# Patient Record
Sex: Male | Born: 1990 | Race: Black or African American | Hispanic: No | Marital: Single | Smoking: Former smoker
Health system: Southern US, Community
[De-identification: ages and names within clinical notes are randomized; demographics above are authoritative.]

## PROBLEM LIST (undated history)

## (undated) ENCOUNTER — Emergency Department (HOSPITAL_COMMUNITY): Disposition: A | Discharge status: Other Acute Inpatient Hospital | Payer: BLUE CROSS/BLUE SHIELD

## (undated) DIAGNOSIS — K219 Gastro-esophageal reflux disease without esophagitis: Secondary | ICD-10-CM

## (undated) DIAGNOSIS — K921 Melena: Secondary | ICD-10-CM

## (undated) DIAGNOSIS — B019 Varicella without complication: Secondary | ICD-10-CM

## (undated) DIAGNOSIS — G43909 Migraine, unspecified, not intractable, without status migrainosus: Secondary | ICD-10-CM

## (undated) HISTORY — DX: Migraine, unspecified, not intractable, without status migrainosus: G43.909

## (undated) HISTORY — DX: Gastro-esophageal reflux disease without esophagitis: K21.9

## (undated) HISTORY — DX: Melena: K92.1

## (undated) HISTORY — DX: Varicella without complication: B01.9

## (undated) HISTORY — PX: HERNIA REPAIR: SHX51

---
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
## 2009-10-03 ENCOUNTER — Emergency Department (HOSPITAL_COMMUNITY): Admission: EM | Admit: 2009-10-03 | Discharge: 2009-10-04 | Payer: Self-pay | Admitting: Emergency Medicine

## 2010-01-11 ENCOUNTER — Emergency Department (HOSPITAL_COMMUNITY): Admission: EM | Admit: 2010-01-11 | Discharge: 2010-01-11 | Payer: Self-pay | Admitting: Emergency Medicine

## 2010-02-09 ENCOUNTER — Emergency Department (HOSPITAL_COMMUNITY): Admission: EM | Admit: 2010-02-09 | Discharge: 2009-04-23 | Payer: Self-pay | Admitting: Emergency Medicine

## 2010-05-16 LAB — URINALYSIS, ROUTINE W REFLEX MICROSCOPIC
Bilirubin Urine: NEGATIVE
Glucose, UA: NEGATIVE mg/dL
Hgb urine dipstick: NEGATIVE
Ketones, ur: NEGATIVE mg/dL
Nitrite: NEGATIVE
Protein, ur: NEGATIVE mg/dL
Specific Gravity, Urine: 1.007 (ref 1.005–1.030)
Urobilinogen, UA: 0.2 mg/dL (ref 0.0–1.0)
pH: 7 (ref 5.0–8.0)

## 2010-05-16 LAB — GC/CHLAMYDIA PROBE AMP, GENITAL
Chlamydia, DNA Probe: NEGATIVE
GC Probe Amp, Genital: NEGATIVE

## 2010-05-19 LAB — GC/CHLAMYDIA PROBE AMP, GENITAL
Chlamydia, DNA Probe: POSITIVE — AB
GC Probe Amp, Genital: NEGATIVE

## 2010-05-19 LAB — RPR: RPR Ser Ql: NONREACTIVE

## 2010-05-25 LAB — URINALYSIS, ROUTINE W REFLEX MICROSCOPIC
Bilirubin Urine: NEGATIVE
Glucose, UA: NEGATIVE mg/dL
Hgb urine dipstick: NEGATIVE
Ketones, ur: NEGATIVE mg/dL
Nitrite: NEGATIVE
Protein, ur: NEGATIVE mg/dL
Specific Gravity, Urine: 1.023 (ref 1.005–1.030)
Urobilinogen, UA: 1 mg/dL (ref 0.0–1.0)
pH: 6.5 (ref 5.0–8.0)

## 2010-05-25 LAB — URINE MICROSCOPIC-ADD ON

## 2012-03-10 ENCOUNTER — Encounter (HOSPITAL_COMMUNITY): Payer: Self-pay | Admitting: Adult Health

## 2012-03-10 ENCOUNTER — Emergency Department (HOSPITAL_COMMUNITY)
Admission: EM | Admit: 2012-03-10 | Discharge: 2012-03-10 | Disposition: A | Discharge status: Home / Self Care | Payer: Self-pay | Attending: Emergency Medicine | Admitting: Emergency Medicine

## 2012-03-10 DIAGNOSIS — R059 Cough, unspecified: Secondary | ICD-10-CM | POA: Insufficient documentation

## 2012-03-10 DIAGNOSIS — J069 Acute upper respiratory infection, unspecified: Secondary | ICD-10-CM | POA: Insufficient documentation

## 2012-03-10 DIAGNOSIS — J3489 Other specified disorders of nose and nasal sinuses: Secondary | ICD-10-CM | POA: Insufficient documentation

## 2012-03-10 DIAGNOSIS — R05 Cough: Secondary | ICD-10-CM | POA: Insufficient documentation

## 2012-03-10 MED ORDER — PROMETHAZINE-CODEINE 6.25-10 MG/5ML PO SYRP
5.0000 mL | ORAL_SOLUTION | Freq: Once | ORAL | Status: AC
Start: 2012-03-10 — End: 2012-03-10
  Administered 2012-03-10: 5 mL via ORAL
  Filled 2012-03-10: qty 5

## 2012-03-10 MED ORDER — PROMETHAZINE-CODEINE 6.25-10 MG/5ML PO SYRP: 5.0000 mL | ORAL_SOLUTION | Freq: Four times a day (QID) | ORAL | Status: DC | PRN

## 2012-03-10 NOTE — ED Provider Notes (Signed)
Medical screening examination/treatment/procedure(s) were performed by non-physician practitioner and as supervising physician I was immediately available for consultation/collaboration.   Glynn Octave, MD 03/10/12 [phone]

## 2012-03-10 NOTE — ED Notes (Signed)
C/O cold for one week, symptoms include cough, sore throat and runny nose. Pt reports taking mucinex and tylenol cold which has helped the sore throat and mucus. He is concerned because he has a non productive cough. He states, "I searched the internet and I am worried I have whooping cough or pertussis." cough present, non productive, Sats 96% RA, airway patent, breathing WNL. Bilateral lung sounds clear.

## 2012-03-10 NOTE — ED Provider Notes (Signed)
History     CSN: [id number]  Arrival date & time 03/10/12  0230   First MD Initiated Contact with Patient 03/10/12 0355      Chief Complaint  Patient presents with  . Cough    (Consider location/radiation/quality/duration/timing/severity/associated sxs/prior treatment) HPI Comments: Patient with cough and URI for over 1 week now mostly the cough which is keeping him awake and interfering with work.  Denies fever, mylagias  Patient is a 22 y.o. male presenting with cough. The history is provided by the patient.  Cough This is a new problem. The current episode started more than 1 week ago. The problem occurs every few minutes. The problem has been gradually worsening. The cough is non-productive. Pertinent negatives include no chills, no headaches, no rhinorrhea and no shortness of breath.    History reviewed. No pertinent past medical history.  History reviewed. No pertinent past surgical history.  History reviewed. No pertinent family history.  History  Substance Use Topics  . Smoking status: Never Smoker   . Smokeless tobacco: Not on file  . Alcohol Use: No      Review of Systems  Constitutional: Negative for fever and chills.  HENT: Positive for congestion. Negative for rhinorrhea and postnasal drip.   Eyes: Negative.   Respiratory: Positive for cough. Negative for shortness of breath.   Gastrointestinal: Negative for nausea.  Genitourinary: Negative.   Neurological: Negative for headaches.  Hematological: Negative for adenopathy.    Allergies  Review of patient's allergies indicates no known allergies.  Home Medications   Current Outpatient Rx  Name  Route  Sig  Dispense  Refill  . PROMETHAZINE-CODEINE 6.25-10 MG/5ML PO SYRP   Oral   Take 5 mLs by mouth every 6 (six) hours as needed for cough.   120 mL   0     BP 123/73  Pulse 75  Temp 97.8 F (36.6 C) (Oral)  Resp 16  SpO2 96%  Physical Exam  Constitutional: He appears well-developed and  well-nourished.  HENT:  Head: Normocephalic.  Mouth/Throat: Uvula is midline. No oropharyngeal exudate, posterior oropharyngeal edema, posterior oropharyngeal erythema or tonsillar abscesses.  Eyes: Pupils are equal, round, and reactive to light.  Neck: Normal range of motion.  Cardiovascular: Normal rate.   Pulmonary/Chest: Breath sounds normal. No respiratory distress. He has no wheezes.       Dry cough in short burst   Musculoskeletal: Normal range of motion.  Neurological: He is alert.  Skin: Skin is warm.    ED Course  Procedures (including critical care time)  Labs Reviewed - No data to display No results found.   1. Cough       MDM  Cough wil treat with Phenergan with codeine         Arman Filter, NP 03/10/12 0434  Arman Filter, NP 03/10/12 0434  Arman Filter, NP 03/10/12 [phone]

## 2012-06-22 ENCOUNTER — Encounter (HOSPITAL_COMMUNITY): Payer: Self-pay | Admitting: *Deleted

## 2012-06-22 ENCOUNTER — Emergency Department (HOSPITAL_COMMUNITY)
Admission: EM | Admit: 2012-06-22 | Discharge: 2012-06-23 | Disposition: A | Discharge status: Home / Self Care | Payer: Self-pay | Attending: Emergency Medicine | Admitting: Emergency Medicine

## 2012-06-22 DIAGNOSIS — R590 Localized enlarged lymph nodes: Secondary | ICD-10-CM

## 2012-06-22 DIAGNOSIS — R599 Enlarged lymph nodes, unspecified: Secondary | ICD-10-CM | POA: Insufficient documentation

## 2012-06-22 LAB — URINALYSIS, ROUTINE W REFLEX MICROSCOPIC
Bilirubin Urine: NEGATIVE
Glucose, UA: NEGATIVE mg/dL
Hgb urine dipstick: NEGATIVE
Ketones, ur: NEGATIVE mg/dL
Leukocytes, UA: NEGATIVE
Nitrite: NEGATIVE
Protein, ur: NEGATIVE mg/dL
Specific Gravity, Urine: 1.021 (ref 1.005–1.030)
Urobilinogen, UA: 1 mg/dL (ref 0.0–1.0)
pH: 7.5 (ref 5.0–8.0)

## 2012-06-22 LAB — CBC WITH DIFFERENTIAL/PLATELET
Basophils Absolute: 0 10*3/uL (ref 0.0–0.1)
Basophils Relative: 0 % (ref 0–1)
Eosinophils Absolute: 0.1 10*3/uL (ref 0.0–0.7)
Eosinophils Relative: 2 % (ref 0–5)
HCT: 40.9 % (ref 39.0–52.0)
Hemoglobin: 15 g/dL (ref 13.0–17.0)
Lymphocytes Relative: 53 % — ABNORMAL HIGH (ref 12–46)
Lymphs Abs: 3 10*3/uL (ref 0.7–4.0)
MCH: 28.6 pg (ref 26.0–34.0)
MCHC: 36.7 g/dL — ABNORMAL HIGH (ref 30.0–36.0)
MCV: 77.9 fL — ABNORMAL LOW (ref 78.0–100.0)
Monocytes Absolute: 0.5 10*3/uL (ref 0.1–1.0)
Monocytes Relative: 8 % (ref 3–12)
Neutro Abs: 2.1 10*3/uL (ref 1.7–7.7)
Neutrophils Relative %: 37 % — ABNORMAL LOW (ref 43–77)
Platelets: 198 10*3/uL (ref 150–400)
RBC: 5.25 MIL/uL (ref 4.22–5.81)
RDW: 11.9 % (ref 11.5–15.5)
WBC: 5.6 10*3/uL (ref 4.0–10.5)

## 2012-06-22 LAB — COMPREHENSIVE METABOLIC PANEL
ALT: 23 U/L (ref 0–53)
AST: 28 U/L (ref 0–37)
Albumin: 4.2 g/dL (ref 3.5–5.2)
Alkaline Phosphatase: 68 U/L (ref 39–117)
BUN: 17 mg/dL (ref 6–23)
CO2: 30 mEq/L (ref 19–32)
Calcium: 9.6 mg/dL (ref 8.4–10.5)
Chloride: 100 mEq/L (ref 96–112)
Creatinine, Ser: 1.28 mg/dL (ref 0.50–1.35)
GFR calc Af Amer: >90 mL/min (ref >90)
GFR calc non Af Amer: 78 mL/min — ABNORMAL LOW (ref >90)
Glucose, Bld: 94 mg/dL (ref 70–99)
Potassium: 3.5 mEq/L (ref 3.5–5.1)
Sodium: 137 mEq/L (ref 135–145)
Total Bilirubin: 1.4 mg/dL — ABNORMAL HIGH (ref 0.3–1.2)
Total Protein: 7.7 g/dL (ref 6.0–8.3)

## 2012-06-22 NOTE — ED Notes (Signed)
Lower abd pain for  3 days no nv or diarrhea.

## 2012-06-23 LAB — GC/CHLAMYDIA PROBE AMP
CT Probe RNA: NEGATIVE
GC Probe RNA: NEGATIVE

## 2012-06-23 MED ORDER — AZITHROMYCIN 250 MG PO TABS
2000.0000 mg | ORAL_TABLET | Freq: Once | ORAL | Status: AC
  Administered 2012-06-23: 2000 mg via ORAL
  Filled 2012-06-23: qty 8

## 2012-06-23 NOTE — ED Provider Notes (Signed)
Medical screening examination/treatment/procedure(s) were performed by non-physician practitioner and as supervising physician I was immediately available for consultation/collaboration.   Mallorey Odonell, MD 06/23/12 0416 

## 2012-06-23 NOTE — ED Provider Notes (Signed)
History     CSN: [id number]  Arrival date & time 06/22/12  2210   First MD Initiated Contact with Patient 06/22/12 2352      Chief Complaint  Patient presents with  . Abdominal Pain    (Consider location/radiation/quality/duration/timing/severity/associated sxs/prior treatment) Patient is a 22 y.o. male presenting with abdominal pain. The history is provided by the patient.  Abdominal Pain Pain location:  LUQ and RLQ Pain quality: aching   Pain severity:  Mild Associated symptoms: no diarrhea, no fever, no nausea and no vomiting   Associated symptoms comment:  Mild lower abdominal pain and "I have knots that hurt". No N, V, fever. No change in bowel habits. No recent abdominal injuries. No back pain. He denies dysuria, penile discharge or testicular discomfort.   History reviewed. No pertinent past medical history.  History reviewed. No pertinent past surgical history.  No family history on file.  History  Substance Use Topics  . Smoking status: Never Smoker   . Smokeless tobacco: Not on file  . Alcohol Use: No      Review of Systems  Constitutional: Negative for fever.  Gastrointestinal: Positive for abdominal pain. Negative for nausea, vomiting, diarrhea and blood in stool.  Genitourinary: Negative for discharge and testicular pain.  Musculoskeletal: Negative for back pain.    Allergies  Penicillins  Home Medications  No current outpatient prescriptions on file.  BP 132/78  Pulse 68  Temp(Src) 98.8 F (37.1 C) (Oral)  Resp 18  SpO2 98%  Physical Exam  Constitutional: He is oriented to person, place, and time. He appears well-developed and well-nourished. No distress.  Abdominal: Soft. He exhibits no mass. There is no tenderness. There is no rebound and no guarding.  Genitourinary:  No testicular tenderness or scrotal swelling. Circumcised penis without obvious discharge. Bilateral inguinal adenopathy.  Neurological: He is alert and oriented to person,  place, and time.  Skin: Skin is warm and dry.  Psychiatric: He has a normal mood and affect.    ED Course  Procedures (including critical care time)  Labs Reviewed  CBC WITH DIFFERENTIAL - Abnormal; Notable for the following:    MCV 77.9 (*)    MCHC 36.7 (*)    Neutrophils Relative 37 (*)    Lymphocytes Relative 53 (*)    All other components within normal limits  COMPREHENSIVE METABOLIC PANEL - Abnormal; Notable for the following:    Total Bilirubin 1.4 (*)    GFR calc non Af Amer 78 (*)    All other components within normal limits  GC/CHLAMYDIA PROBE AMP  URINALYSIS, ROUTINE W REFLEX MICROSCOPIC   No results found.   No diagnosis found.  1. Inguinal lymphadenopathy   MDM  Abdomen completely benign, non-tender. Given inguinal lymphadenopathy, suspect pelvic infection, most likely STD. Patient treated with azithromycin. Cultures pending.        Arnoldo Hooker, PA-C 06/23/12 0037

## 2012-06-26 ENCOUNTER — Encounter (HOSPITAL_COMMUNITY): Payer: Self-pay | Admitting: Adult Health

## 2012-06-26 ENCOUNTER — Emergency Department (HOSPITAL_COMMUNITY)
Admission: EM | Admit: 2012-06-26 | Discharge: 2012-06-26 | Disposition: A | Discharge status: Home / Self Care | Payer: Self-pay | Attending: Emergency Medicine | Admitting: Emergency Medicine

## 2012-06-26 DIAGNOSIS — L738 Other specified follicular disorders: Secondary | ICD-10-CM | POA: Insufficient documentation

## 2012-06-26 DIAGNOSIS — L739 Follicular disorder, unspecified: Secondary | ICD-10-CM

## 2012-06-26 DIAGNOSIS — R599 Enlarged lymph nodes, unspecified: Secondary | ICD-10-CM | POA: Insufficient documentation

## 2012-06-26 DIAGNOSIS — Z8619 Personal history of other infectious and parasitic diseases: Secondary | ICD-10-CM | POA: Insufficient documentation

## 2012-06-26 DIAGNOSIS — N489 Disorder of penis, unspecified: Secondary | ICD-10-CM | POA: Insufficient documentation

## 2012-06-26 MED ORDER — MUPIROCIN CALCIUM 2 % EX CREA: TOPICAL_CREAM | Freq: Three times a day (TID) | CUTANEOUS | Status: DC

## 2012-06-26 NOTE — ED Notes (Signed)
The patient is AOx4 and comfortable with his discharge instructions. 

## 2012-06-26 NOTE — ED Notes (Signed)
Was seen here on 4/21 and dx with inguinal lymphadenopathy, given GC/Chlamydia treatment and told to come back if symptoms do not resolve.  PT reports that lymph nodes did not go down and now he has a weeping open rash around penis and on penis, denies pain, reports soreness after urination.

## 2012-06-27 NOTE — ED Provider Notes (Signed)
History     CSN: [id number]  Arrival date & time 06/26/12  0022   None     Chief Complaint  Patient presents with  . Penis Pain  . Rash    (Consider location/radiation/quality/duration/timing/severity/associated sxs/prior treatment) HPI History provided by pt.   Pt presents w/ mildly painful rash at base of shaft of penis x several days.  Per prior chart, was evaluated for lower abd as well as groin pain on 06/23/12, was found to have inguinal lymphadenopathy, and was treated for possible STD.  GC/Chlam cultures negative.  Pt reports that the rash was present at that time but it is worse now.  Continues to have "painful knots" in groin as well.  No associated fever, abd pain, dysuria, urethral discharge or testicular pain.  No h/o STDs.    History reviewed. No pertinent past medical history.  History reviewed. No pertinent past surgical history.  History reviewed. No pertinent family history.  History  Substance Use Topics  . Smoking status: Never Smoker   . Smokeless tobacco: Not on file  . Alcohol Use: No      Review of Systems  All other systems reviewed and are negative.    Allergies  Penicillins  Home Medications   Current Outpatient Rx  Name  Route  Sig  Dispense  Refill  . mupirocin cream (BACTROBAN) 2 %   Topical   Apply topically 3 (three) times daily.   15 g   0     BP 115/77  Pulse 68  Temp(Src) 97.1 F (36.2 C) (Oral)  Resp 16  Ht 5\' 8"  (1.727 m)  Wt 140 lb (63.504 kg)  BMI 21.29 kg/m2  SpO2 98%  Physical Exam  Nursing note and vitals reviewed. Constitutional: He is oriented to person, place, and time. He appears well-developed and well-nourished. No distress.  HENT:  Head: Normocephalic and atraumatic.  Eyes:  Normal appearance  Neck: Normal range of motion.  No axillary lymphadenopathy  Cardiovascular: Normal rate and regular rhythm.   Pulmonary/Chest: Effort normal and breath sounds normal. No respiratory distress.  Abdominal:  Soft. Bowel sounds are normal. He exhibits no distension and no mass. There is no tenderness. There is no rebound and no guarding.  Genitourinary:  Pt shaves hair at base of penis.  There are several, discrete, 0.5cm scabbed lesions overlying hair follicles in this location, particularly on L side.  No drainage or surrounding erythema.  Ttp.  Circumcised.  No urethral discharge.  Testicles descended bilaterally and non-tender.  Bilateral inguinal lymphadenopathy.   Musculoskeletal: Normal range of motion.  Lymphadenopathy:    He has no cervical adenopathy.  Neurological: He is alert and oriented to person, place, and time.  Skin: Skin is warm and dry. No rash noted.  Psychiatric: He has a normal mood and affect. His behavior is normal.    ED Course  Procedures (including critical care time)  Labs Reviewed - No data to display No results found.   1. Folliculitis       MDM  22yo M presents w/ genitalia rash.  Evaluated 4/21 for groin pain that was attributed to inguinal lymphadenopathy, GC/Chlam cultures obtained at that time and neg, rash not appreciated on exam.  Exam today most consistent w/ folliculitis.   Prescribed bactroban and recommended f/u with urology for persistent sx.  Return precautions discussed.         Otilio Miu, PA-C 06/27/12 [phone]

## 2012-06-27 NOTE — ED Provider Notes (Signed)
Medical screening examination/treatment/procedure(s) were performed by non-physician practitioner and as supervising physician I was immediately available for consultation/collaboration.  Olivia Mackie, MD 06/27/12 1031

## 2013-12-23 ENCOUNTER — Encounter: Payer: Self-pay | Admitting: Internal Medicine

## 2013-12-23 ENCOUNTER — Encounter (HOSPITAL_COMMUNITY): Payer: Self-pay | Admitting: Emergency Medicine

## 2013-12-23 ENCOUNTER — Emergency Department (HOSPITAL_COMMUNITY)
Admission: EM | Admit: 2013-12-23 | Discharge: 2013-12-23 | Disposition: A | Discharge status: Home / Self Care | Payer: BC Managed Care – PPO | Attending: Emergency Medicine | Admitting: Emergency Medicine

## 2013-12-23 DIAGNOSIS — R1011 Right upper quadrant pain: Secondary | ICD-10-CM | POA: Insufficient documentation

## 2013-12-23 DIAGNOSIS — Z880 Allergy status to penicillin: Secondary | ICD-10-CM | POA: Insufficient documentation

## 2013-12-23 DIAGNOSIS — R109 Unspecified abdominal pain: Secondary | ICD-10-CM

## 2013-12-23 LAB — CBC WITH DIFFERENTIAL/PLATELET
Basophils Absolute: 0 10*3/uL (ref 0.0–0.1)
Basophils Relative: 0 % (ref 0–1)
Eosinophils Absolute: 0.2 10*3/uL (ref 0.0–0.7)
Eosinophils Relative: 3 % (ref 0–5)
HCT: 44.7 % (ref 39.0–52.0)
Hemoglobin: 16.3 g/dL (ref 13.0–17.0)
Lymphocytes Relative: 55 % — ABNORMAL HIGH (ref 12–46)
Lymphs Abs: 3.6 10*3/uL (ref 0.7–4.0)
MCH: 29.9 pg (ref 26.0–34.0)
MCHC: 36.5 g/dL — ABNORMAL HIGH (ref 30.0–36.0)
MCV: 82 fL (ref 78.0–100.0)
Monocytes Absolute: 0.3 10*3/uL (ref 0.1–1.0)
Monocytes Relative: 5 % (ref 3–12)
Neutro Abs: 2.5 10*3/uL (ref 1.7–7.7)
Neutrophils Relative %: 37 % — ABNORMAL LOW (ref 43–77)
Platelets: 205 10*3/uL (ref 150–400)
RBC: 5.45 MIL/uL (ref 4.22–5.81)
RDW: 12.1 % (ref 11.5–15.5)
WBC: 6.6 10*3/uL (ref 4.0–10.5)

## 2013-12-23 LAB — URINALYSIS, ROUTINE W REFLEX MICROSCOPIC
Bilirubin Urine: NEGATIVE
Glucose, UA: NEGATIVE mg/dL
Hgb urine dipstick: NEGATIVE
Ketones, ur: NEGATIVE mg/dL
Nitrite: NEGATIVE
Protein, ur: NEGATIVE mg/dL
Specific Gravity, Urine: 1.026 (ref 1.005–1.030)
Urobilinogen, UA: 1 mg/dL (ref 0.0–1.0)
pH: 6 (ref 5.0–8.0)

## 2013-12-23 LAB — COMPREHENSIVE METABOLIC PANEL
ALT: 17 U/L (ref 0–53)
AST: 24 U/L (ref 0–37)
Albumin: 4.5 g/dL (ref 3.5–5.2)
Alkaline Phosphatase: 68 U/L (ref 39–117)
Anion gap: 12 (ref 5–15)
BUN: 15 mg/dL (ref 6–23)
CO2: 28 mEq/L (ref 19–32)
Calcium: 9.9 mg/dL (ref 8.4–10.5)
Chloride: 102 mEq/L (ref 96–112)
Creatinine, Ser: 0.97 mg/dL (ref 0.50–1.35)
GFR calc Af Amer: >90 mL/min (ref >90)
GFR calc non Af Amer: >90 mL/min (ref >90)
Glucose, Bld: 90 mg/dL (ref 70–99)
Potassium: 3.9 mEq/L (ref 3.7–5.3)
Sodium: 142 mEq/L (ref 137–147)
Total Bilirubin: 1.3 mg/dL — ABNORMAL HIGH (ref 0.3–1.2)
Total Protein: 8.4 g/dL — ABNORMAL HIGH (ref 6.0–8.3)

## 2013-12-23 LAB — LIPASE, BLOOD: Lipase: 27 U/L (ref 11–59)

## 2013-12-23 LAB — URINE MICROSCOPIC-ADD ON

## 2013-12-23 NOTE — ED Notes (Signed)
Pt given water to drink. 

## 2013-12-23 NOTE — Discharge Instructions (Signed)

## 2013-12-23 NOTE — ED Provider Notes (Signed)
CSN: [id number]     Arrival date & time 12/23/13  0409 History   First MD Initiated Contact with Patient 12/23/13 0606     Chief Complaint  Patient presents with  . Abdominal Pain     (Consider location/radiation/quality/duration/timing/severity/associated sxs/prior Treatment) HPI Comments: Patient presents to the emergency department with chief complaint of left lower abdominal pain times one year. He states that he has had intermittent "spasms and swelling sensation" of the left lower abdomen. He states that it mostly occurs while he is at work. He denies any known aggravating or alleviating factors. Denies any fevers, chills, nausea, vomiting, diarrhea, constipation, or dysuria. He has not taken anything to alleviate his symptoms. He has no prior abdominal surgeries. No other past medical problems. He states that he wants to be evaluated today because he just got health insurance. However, currently he denies any symptoms.  The history is provided by the patient. No language interpreter was used.    History reviewed. No pertinent past medical history. History reviewed. No pertinent past surgical history. No family history on file. History  Substance Use Topics  . Smoking status: Never Smoker   . Smokeless tobacco: Not on file  . Alcohol Use: No    Review of Systems  Constitutional: Negative for fever and chills.  Respiratory: Negative for shortness of breath.   Cardiovascular: Negative for chest pain.  Gastrointestinal: Negative for nausea, vomiting, diarrhea and constipation.  Genitourinary: Negative for dysuria.  All other systems reviewed and are negative.     Allergies  Penicillins  Home Medications   Prior to Admission medications   Not on File   BP 125/81  Pulse 78  Temp(Src) 98.1 F (36.7 C) (Oral)  Resp 18  Ht 5\' 8"  (1.727 m)  Wt 145 lb (65.772 kg)  BMI 22.05 kg/m2  SpO2 100% Physical Exam  Nursing note and vitals reviewed. Constitutional: He is  oriented to person, place, and time. He appears well-developed and well-nourished.  Well-appearing, not in any distress  HENT:  Head: Normocephalic and atraumatic.  Eyes: Conjunctivae and EOM are normal. Pupils are equal, round, and reactive to light. Right eye exhibits no discharge. Left eye exhibits no discharge. No scleral icterus.  Neck: Normal range of motion. Neck supple. No JVD present.  Cardiovascular: Normal rate, regular rhythm and normal heart sounds.  Exam reveals no gallop and no friction rub.   No murmur heard. Pulmonary/Chest: Effort normal and breath sounds normal. No respiratory distress. He has no wheezes. He has no rales. He exhibits no tenderness.  Clear to auscultation bilaterally  Abdominal: Soft. He exhibits no distension and no mass. There is no tenderness. There is no rebound and no guarding.  No focal abdominal tenderness, no RLQ tenderness or pain at McBurney's point, no RUQ tenderness or Murphy's sign, no left-sided abdominal tenderness, no fluid wave, or signs of peritonitis   Musculoskeletal: Normal range of motion. He exhibits no edema and no tenderness.  Neurological: He is alert and oriented to person, place, and time.  Skin: Skin is warm and dry.  Psychiatric: He has a normal mood and affect. His behavior is normal. Judgment and thought content normal.    ED Course  Procedures (including critical care time) Results for orders placed during the hospital encounter of 12/23/13  CBC WITH DIFFERENTIAL      Result Value Ref Range   WBC 6.6  4.0 - 10.5 K/uL   RBC 5.45  4.22 - 5.81 MIL/uL   Hemoglobin 16.3  13.0 - 17.0 g/dL   HCT 04.544.7  40.939.0 - 81.152.0 %   MCV 82.0  78.0 - 100.0 fL   MCH 29.9  26.0 - 34.0 pg   MCHC 36.5 (*) 30.0 - 36.0 g/dL   RDW 91.412.1  78.211.5 - 95.615.5 %   Platelets 205  150 - 400 K/uL   Neutrophils Relative % 37 (*) 43 - 77 %   Neutro Abs 2.5  1.7 - 7.7 K/uL   Lymphocytes Relative 55 (*) 12 - 46 %   Lymphs Abs 3.6  0.7 - 4.0 K/uL   Monocytes  Relative 5  3 - 12 %   Monocytes Absolute 0.3  0.1 - 1.0 K/uL   Eosinophils Relative 3  0 - 5 %   Eosinophils Absolute 0.2  0.0 - 0.7 K/uL   Basophils Relative 0  0 - 1 %   Basophils Absolute 0.0  0.0 - 0.1 K/uL  COMPREHENSIVE METABOLIC PANEL      Result Value Ref Range   Sodium 142  137 - 147 mEq/L   Potassium 3.9  3.7 - 5.3 mEq/L   Chloride 102  96 - 112 mEq/L   CO2 28  19 - 32 mEq/L   Glucose, Bld 90  70 - 99 mg/dL   BUN 15  6 - 23 mg/dL   Creatinine, Ser 2.130.97  0.50 - 1.35 mg/dL   Calcium 9.9  8.4 - 08.610.5 mg/dL   Total Protein 8.4 (*) 6.0 - 8.3 g/dL   Albumin 4.5  3.5 - 5.2 g/dL   AST 24  0 - 37 U/L   ALT 17  0 - 53 U/L   Alkaline Phosphatase 68  39 - 117 U/L   Total Bilirubin 1.3 (*) 0.3 - 1.2 mg/dL   GFR calc non Af Amer >90  >90 mL/min   GFR calc Af Amer >90  >90 mL/min   Anion gap 12  5 - 15  URINALYSIS, ROUTINE W REFLEX MICROSCOPIC      Result Value Ref Range   Color, Urine YELLOW  YELLOW   APPearance CLEAR  CLEAR   Specific Gravity, Urine 1.026  1.005 - 1.030   pH 6.0  5.0 - 8.0   Glucose, UA NEGATIVE  NEGATIVE mg/dL   Hgb urine dipstick NEGATIVE  NEGATIVE   Bilirubin Urine NEGATIVE  NEGATIVE   Ketones, ur NEGATIVE  NEGATIVE mg/dL   Protein, ur NEGATIVE  NEGATIVE mg/dL   Urobilinogen, UA 1.0  0.0 - 1.0 mg/dL   Nitrite NEGATIVE  NEGATIVE   Leukocytes, UA SMALL (*) NEGATIVE  LIPASE, BLOOD      Result Value Ref Range   Lipase 27  11 - 59 U/L  URINE MICROSCOPIC-ADD ON      Result Value Ref Range   WBC, UA 0-2  <3 WBC/hpf   No results found.   Imaging Review No results found.   EKG Interpretation None      MDM   Final diagnoses:  Abdominal pain, unspecified abdominal location    Patient with intermittent abdominal spasms x 1 year.  No fevers, chills, n/v/d.  No new changes.  Pain free at this time, only wanted to be evaluated because he just got insurance.  Abdomen is soft and non-tender.  Benign appearing.  I will check basic labs, but have advised  the patient that if he feels well and labs are reassuring, we will refer him for outpatient follow-up.  I doubt any acute abdominal process.  No risk  factors for SBO, no RLQ tenderness or signs of appy.  9:17 AM Patient reassessed, still not exhibiting any pain. Abdomen is benign. Discharged home. Return precautions given.  Patient instructed to return for:  New or worsening symptoms, including, increased abdominal pain, especially pain that localizes to one side, bloody vomit, bloody diarrhea, fever >101, and intractable vomiting.   Roxy Horseman, PA-C 12/23/13 [phone]

## 2013-12-23 NOTE — ED Notes (Signed)
Pt. reports intermittent left lower abdominal swelling with mild pain , denies dysuria , no injury , denies fever .

## 2013-12-24 LAB — GC/CHLAMYDIA PROBE AMP
CT Probe RNA: NEGATIVE
GC Probe RNA: NEGATIVE

## 2014-01-01 NOTE — ED Provider Notes (Signed)
Medical screening examination/treatment/procedure(s) were performed by non-physician practitioner and as supervising physician I was immediately available for consultation/collaboration.   EKG Interpretation None        Loren Raceravid Kiree Dejarnette, MD 01/01/14 [phone]

## 2014-02-17 ENCOUNTER — Encounter: Payer: Self-pay | Admitting: Internal Medicine

## 2014-02-17 ENCOUNTER — Ambulatory Visit (INDEPENDENT_AMBULATORY_CARE_PROVIDER_SITE_OTHER): Payer: BC Managed Care – PPO | Admitting: Internal Medicine

## 2014-02-17 VITALS — BP 110/72 | HR 76 | Ht 68.0 in | Wt 141.6 lb

## 2014-02-17 DIAGNOSIS — K219 Gastro-esophageal reflux disease without esophagitis: Secondary | ICD-10-CM

## 2014-02-17 DIAGNOSIS — K4090 Unilateral inguinal hernia, without obstruction or gangrene, not specified as recurrent: Secondary | ICD-10-CM

## 2014-02-17 NOTE — Progress Notes (Signed)
HISTORY OF PRESENT ILLNESS:  Darius Espinoza is a 23 y.o. male with no significant past medical history who presents today with a 4 to five-month history of intermittent left inguinal pain. The patient went to the hospital in October 2015 for the same. This was described as left lower quadrant pain. The area of concern was not examined. CBC, comprehensive metabolic panel, urinalysis and lipase were all unremarkable. The patient describes a left groin burning sensation with bulge most noticeable when standing for long periods of time, such as at work. Improved with lying. He has no urinary or bowel complaints. His appetite has been good and weight stable. His review of systems is otherwise remarkable for occasional indigestion. He inquires about obtaining a primary care physician  REVIEW OF SYSTEMS:  All non-GI ROS negative except for back pain, fatigue, depression, sleeping problems, muscle cramps, itching, visual change  History reviewed. No pertinent past medical history.  History reviewed. No pertinent past surgical history.  Social History Darius Espinoza  reports that he has been smoking.  He has never used smokeless tobacco. He reports that he drinks alcohol. He reports that he does not use illicit drugs.  family history includes Breast cancer in his maternal grandmother and paternal grandmother; Diabetes in his father, mother, and sister.  Allergies  Allergen Reactions  . Penicillins Other (See Comments)    Reaction unknown       PHYSICAL EXAMINATION: Vital signs: BP 110/72 mmHg  Pulse 76  Ht 5\' 8"  (1.727 m)  Wt 141 lb 9.6 oz (64.229 kg)  BMI 21.54 kg/m2  Constitutional: generally well-appearing, no acute distress Psychiatric: alert and oriented x3, cooperative Eyes: extraocular movements intact, anicteric, conjunctiva pink Mouth: oral pharynx moist, no lesions Neck: supple no lymphadenopathy Cardiovascular: heart regular rate and rhythm, no murmur Lungs: clear to auscultation  bilaterally Abdomen: soft, nontender, nondistended, no obvious ascites, no peritoneal signs, normal bowel sounds, no organomegaly Groin. Direct and indirect exam reveals probable left inguinal hernia with bulge and tenderness Rectal: Omitted Extremities: no lower extremity edema bilaterally Skin: Multiple tattoos. no lesions on visible extremities Neuro: No focal deficits.   ASSESSMENT:  #1. Probable left inguinal hernia #2. Desires a primary care provider #3. GERD without alarm features   PLAN:  #1. Referral for surgical opinion with Dr. Johna SheriffHoxworth #2. Reflux precautions. When necessary acid reducing agents #3. Refer to primary care to establish with PCP as requested

## 2014-02-17 NOTE — Patient Instructions (Signed)
You have been scheduled for a consultation with Dr. Johna SheriffHoxworth at [facility] on 03/18/2014 at 9:15.  Please arrive 15 minutes early.  They are located at [address].  The phone number is [phone]

## 2014-03-11 ENCOUNTER — Encounter: Payer: Self-pay | Admitting: Family

## 2014-03-11 ENCOUNTER — Ambulatory Visit (INDEPENDENT_AMBULATORY_CARE_PROVIDER_SITE_OTHER): Payer: BLUE CROSS/BLUE SHIELD | Admitting: Family

## 2014-03-11 VITALS — BP 120/80 | HR 62 | Temp 97.7°F | Resp 18 | Ht 68.0 in | Wt 147.0 lb

## 2014-03-11 DIAGNOSIS — R51 Headache: Secondary | ICD-10-CM

## 2014-03-11 DIAGNOSIS — R519 Headache, unspecified: Secondary | ICD-10-CM

## 2014-03-11 NOTE — Patient Instructions (Signed)
Thank you for choosing [facility].  Summary/Instructions:  Please schedule a time for your physical at your convenience.   Type 2 Diabetes Mellitus Type 2 diabetes mellitus, often simply referred to as type 2 diabetes, is a long-lasting (chronic) disease. In type 2 diabetes, the pancreas does not make enough insulin (a hormone), the cells are less responsive to the insulin that is made (insulin resistance), or both. Normally, insulin moves sugars from food into the tissue cells. The tissue cells use the sugars for energy. The lack of insulin or the lack of normal response to insulin causes excess sugars to build up in the blood instead of going into the tissue cells. As a result, high blood sugar (hyperglycemia) develops. The effect of high sugar (glucose) levels can cause many complications. Type 2 diabetes was also previously called adult-onset diabetes, but it can occur at any age.  RISK FACTORS  A person is predisposed to developing type 2 diabetes if someone in the family has the disease and also has one or more of the following primary risk factors:  Overweight.  An inactive lifestyle.  A history of consistently eating high-calorie foods. Maintaining a normal weight and regular physical activity can reduce the chance of developing type 2 diabetes. SYMPTOMS  A person with type 2 diabetes may not show symptoms initially. The symptoms of type 2 diabetes appear slowly. The symptoms include:  Increased thirst (polydipsia).  Increased urination (polyuria).  Increased urination during the night (nocturia).  Weight loss. This weight loss may be rapid.  Frequent, recurring infections.  Tiredness (fatigue).  Weakness.  Vision changes, such as blurred vision.  Fruity smell to your breath.  Abdominal pain.  Nausea or vomiting.  Cuts or bruises which are slow to heal.  Tingling or numbness in the hands or feet. DIAGNOSIS Type 2 diabetes is frequently not diagnosed  until complications of diabetes are present. Type 2 diabetes is diagnosed when symptoms or complications are present and when blood glucose levels are increased. Your blood glucose level may be checked by one or more of the following blood tests:  A fasting blood glucose test. You will not be allowed to eat for at least 8 hours before a blood sample is taken.  A random blood glucose test. Your blood glucose is checked at any time of the day regardless of when you ate.  A hemoglobin A1c blood glucose test. A hemoglobin A1c test provides information about blood glucose control over the previous 3 months.  An oral glucose tolerance test (OGTT). Your blood glucose is measured after you have not eaten (fasted) for 2 hours and then after you drink a glucose-containing beverage. TREATMENT   You may need to take insulin or diabetes medicine daily to keep blood glucose levels in the desired range.  If you use insulin, you may need to adjust the dosage depending on the carbohydrates that you eat with each meal or snack. The treatment goal is to maintain the before meal blood sugar (preprandial glucose) level at 70-130 mg/dL. HOME CARE INSTRUCTIONS   Have your hemoglobin A1c level checked twice a year.  Perform daily blood glucose monitoring as directed by your health care provider.  Monitor urine ketones when you are ill and as directed by your health care provider.  Take your diabetes medicine or insulin as directed by your health care provider to maintain your blood glucose levels in the desired range.  Never run out of diabetes medicine or insulin. It is needed every day.  If you are using insulin, you may need to adjust the amount of insulin given based on your intake of carbohydrates. Carbohydrates can raise blood glucose levels but need to be included in your diet. Carbohydrates provide vitamins, minerals, and fiber which are an essential part of a healthy diet. Carbohydrates are found in  fruits, vegetables, whole grains, dairy products, legumes, and foods containing added sugars.  Eat healthy foods. You should make an appointment to see a registered dietitian to help you create an eating plan that is right for you.  Lose weight if you are overweight.  Carry a medical alert card or wear your medical alert jewelry.  Carry a 15-gram carbohydrate snack with you at all times to treat low blood glucose (hypoglycemia). Some examples of 15-gram carbohydrate snacks include:  Glucose tablets, 3 or 4.  Glucose gel, 15-gram tube.  Raisins, 2 tablespoons (24 grams).  Jelly beans, 6.  Animal crackers, 8.  Regular pop, 4 ounces (120 mL).  Gummy treats, 9.  Recognize hypoglycemia. Hypoglycemia occurs with blood glucose levels of 70 mg/dL and below. The risk for hypoglycemia increases when fasting or skipping meals, during or after intense exercise, and during sleep. Hypoglycemia symptoms can include:  Tremors or shakes.  Decreased ability to concentrate.  Sweating.  Increased heart rate.  Headache.  Dry mouth.  Hunger.  Irritability.  Anxiety.  Restless sleep.  Altered speech or coordination.  Confusion.  Treat hypoglycemia promptly. If you are alert and able to safely swallow, follow the 15:15 rule:  Take 15-20 grams of rapid-acting glucose or carbohydrate. Rapid-acting options include glucose gel, glucose tablets, or 4 ounces (120 mL) of fruit juice, regular soda, or low-fat milk.  Check your blood glucose level 15 minutes after taking the glucose.  Take 15-20 grams more of glucose if the repeat blood glucose level is still 70 mg/dL or below.  Eat a meal or snack within 1 hour once blood glucose levels return to normal.  Be alert to feeling very thirsty and urinating more frequently than usual, which are early signs of hyperglycemia. An early awareness of hyperglycemia allows for prompt treatment. Treat hyperglycemia as directed by your health care  provider.  Engage in at least 150 minutes of moderate-intensity physical activity a week, spread over at least 3 days of the week or as directed by your health care provider. In addition, you should engage in resistance exercise at least 2 times a week or as directed by your health care provider. Try to spend no more than 90 minutes at one time inactive.  Adjust your medicine and food intake as needed if you start a new exercise or sport.  Follow your sick-day plan anytime you are unable to eat or drink as usual.  Do not use any tobacco products including cigarettes, chewing tobacco, or electronic cigarettes. If you need help quitting, ask your health care provider.  Limit alcohol intake to no more than 1 drink per day for nonpregnant women and 2 drinks per day for men. You should drink alcohol only when you are also eating food. Talk with your health care provider whether alcohol is safe for you. Tell your health care provider if you drink alcohol several times a week.  Keep all follow-up visits as directed by your health care provider. This is important.  Schedule an eye exam soon after the diagnosis of type 2 diabetes and then annually.  Perform daily skin and foot care. Examine your skin and feet daily for cuts, bruises,  redness, nail problems, bleeding, blisters, or sores. A foot exam by a health care provider should be done annually.  Brush your teeth and gums at least twice a day and floss at least once a day. Follow up with your dentist regularly.  Share your diabetes management plan with your workplace or school.  Stay up-to-date with immunizations. It is recommended that people with diabetes who are over 25 years old get the pneumonia vaccine. In some cases, two separate shots may be given. Ask your health care provider if your pneumonia vaccination is up-to-date.  Learn to manage stress.  Obtain ongoing diabetes education and support as needed.  Participate in or seek  rehabilitation as needed to maintain or improve independence and quality of life. Request a physical or occupational therapy referral if you are having foot or hand numbness, or difficulties with grooming, dressing, eating, or physical activity. SEEK MEDICAL CARE IF:   You are unable to eat food or drink fluids for more than 6 hours.  You have nausea and vomiting for more than 6 hours.  Your blood glucose level is over 240 mg/dL.  There is a change in mental status.  You develop an additional serious illness.  You have diarrhea for more than 6 hours.  You have been sick or have had a fever for a couple of days and are not getting better.  You have pain during any physical activity.  SEEK IMMEDIATE MEDICAL CARE IF:  You have difficulty breathing.  You have moderate to large ketone levels. MAKE SURE YOU:  Understand these instructions.  Will watch your condition.  Will get help right away if you are not doing well or get worse. Document Released: 02/19/2005 Document Revised: 07/06/2013 Document Reviewed: 09/18/2011 [facility] Patient Information 2015 [place] [place]. This information is not intended to replace advice given to you by your health care provider. Make sure you discuss any questions you have with your health care provider.

## 2014-03-11 NOTE — Assessment & Plan Note (Signed)
Generalized headaches cannot rule out tension headaches versus migraine. Continue to use food and ibuprofen as needed for headache relief. If symptoms become intractable to ibuprofen will pursue further prescription medications.

## 2014-03-11 NOTE — Progress Notes (Signed)
Pre visit review using our clinic review tool, if applicable. No additional management support is needed unless otherwise documented below in the visit note. 

## 2014-03-11 NOTE — Progress Notes (Signed)
   Subjective:    Patient ID: Darius Espinoza, male    DOB: 01/27/1991, 24 y.o.   MRN: [id number]  Chief Complaint  Patient presents with  . Establish Care    no concerns or issues  . Headache    HPI:  Darius Espinoza is a 24 y.o. male who presents today to establish care.     Headaches - Most recent throbbing type headache which was generalized around his head was a few days ago. Denies any sensitivity to light/sound or nausea or vomiting. Treated with food or ibuprofen or food which tend to decrease the pain. Denies any trauma.   Allergies  Allergen Reactions  . Penicillins Other (See Comments)    Reaction unknown    No current outpatient prescriptions on file prior to visit.   No current facility-administered medications on file prior to visit.    Past Medical History  Diagnosis Date  . Blood in stool   . Chicken pox   . GERD (gastroesophageal reflux disease)   . Migraine    History reviewed. No pertinent past surgical history.   Family History  Problem Relation Age of Onset  . Diabetes Mother   . Heart disease Mother   . Hypertension Mother   . Diabetes Father   . Diabetes Sister   . Hypertension Sister   . Breast cancer Maternal Grandmother   . Breast cancer Paternal Grandmother     History   Social History  . Marital Status: Single    Spouse Name: N/A    Number of Children: 1  . Years of Education: N/A   Occupational History  . Buyer, retailKitchen worker    Social History Main Topics  . Smoking status: Current Some Day Smoker -- 0.01 packs/day for 10 years    Types: Cigarettes, Cigars  . Smokeless tobacco: Never Used  . Alcohol Use: 1.2 oz/week    0 Not specified, 1 Cans of beer, 1 Shots of liquor per week  . Drug Use: No  . Sexual Activity: Not on file   Other Topics Concern  . Not on file   Social History Narrative    Review of Systems  Eyes: Negative for visual disturbance.  Musculoskeletal: Negative for neck pain.  Neurological: Negative for  headaches.      Objective:    BP 120/80 mmHg  Pulse 62  Temp(Src) 97.7 F (36.5 C) (Oral)  Resp 18  Ht 5\' 8"  (1.727 m)  Wt 147 lb (66.679 kg)  BMI 22.36 kg/m2  SpO2 97% Nursing note and vital signs reviewed.  Physical Exam  Constitutional: He is oriented to person, place, and time. He appears well-developed and well-nourished. No distress.  Cardiovascular: Normal rate, regular rhythm, normal heart sounds and intact distal pulses.   Pulmonary/Chest: Effort normal and breath sounds normal.  Neurological: He is alert and oriented to person, place, and time.  Skin: Skin is warm and dry.  Psychiatric: He has a normal mood and affect. His behavior is normal. Judgment and thought content normal.       Assessment & Plan:

## 2014-03-12 ENCOUNTER — Telehealth: Payer: Self-pay

## 2014-03-12 NOTE — Telephone Encounter (Signed)
emmi mailed  °

## 2014-06-18 ENCOUNTER — Ambulatory Visit (INDEPENDENT_AMBULATORY_CARE_PROVIDER_SITE_OTHER): Payer: BLUE CROSS/BLUE SHIELD | Admitting: Family

## 2014-06-18 ENCOUNTER — Encounter: Payer: Self-pay | Admitting: Family

## 2014-06-18 ENCOUNTER — Other Ambulatory Visit (INDEPENDENT_AMBULATORY_CARE_PROVIDER_SITE_OTHER): Payer: BLUE CROSS/BLUE SHIELD

## 2014-06-18 VITALS — BP 118/86 | HR 67 | Temp 97.7°F | Resp 18 | Ht 68.0 in | Wt 141.8 lb

## 2014-06-18 DIAGNOSIS — Z0000 Encounter for general adult medical examination without abnormal findings: Secondary | ICD-10-CM

## 2014-06-18 DIAGNOSIS — Z23 Encounter for immunization: Secondary | ICD-10-CM | POA: Diagnosis not present

## 2014-06-18 LAB — CBC
HEMATOCRIT: 41.6 % (ref 39.0–52.0)
HEMOGLOBIN: 14.6 g/dL (ref 13.0–17.0)
MCHC: 35.1 g/dL (ref 30.0–36.0)
MCV: 82 fl (ref 78.0–100.0)
Platelets: 237 10*3/uL (ref 150.0–400.0)
RBC: 5.07 Mil/uL (ref 4.22–5.81)
RDW: 12.7 % (ref 11.5–15.5)
WBC: 5.7 10*3/uL (ref 4.0–10.5)

## 2014-06-18 LAB — LIPID PANEL
CHOL/HDL RATIO: 4
Cholesterol: 147 mg/dL (ref 0–200)
HDL: 37.8 mg/dL — AB (ref >39.00)
LDL Cholesterol: 94 mg/dL (ref 0–99)
NONHDL: 109.2
Triglycerides: 74 mg/dL (ref 0.0–149.0)
VLDL: 14.8 mg/dL (ref 0.0–40.0)

## 2014-06-18 LAB — B12 AND FOLATE PANEL
Folate: 13 ng/mL (ref >5.9)
Vitamin B-12: 466 pg/mL (ref 211–911)

## 2014-06-18 LAB — BASIC METABOLIC PANEL
BUN: 17 mg/dL (ref 6–23)
CHLORIDE: 103 meq/L (ref 96–112)
CO2: 29 meq/L (ref 19–32)
Calcium: 9.8 mg/dL (ref 8.4–10.5)
Creatinine, Ser: 1.01 mg/dL (ref 0.40–1.50)
GFR: 115.77 mL/min (ref >60.00)
GLUCOSE: 101 mg/dL — AB (ref 70–99)
POTASSIUM: 4.1 meq/L (ref 3.5–5.1)
Sodium: 138 mEq/L (ref 135–145)

## 2014-06-18 LAB — TSH: TSH: 1.65 u[IU]/mL (ref 0.35–4.50)

## 2014-06-18 MED ORDER — MUPIROCIN CALCIUM 2 % EX CREA: 1.0000 "application " | TOPICAL_CREAM | Freq: Two times a day (BID) | CUTANEOUS | Status: DC

## 2014-06-18 NOTE — Progress Notes (Signed)
Subjective:    Patient ID: Darius Espinoza, male    DOB: 1990-09-01, 24 y.o.   MRN: [id number]  Chief Complaint  Patient presents with  . CPE    fasting    HPI:  Darius Espinoza is a 24 y.o. male who presents today for an annual wellness visit.   1) Health Maintenance -   Diet - Averages about 2-3 per day; balance of fruits, vegetables, proteins; 1 cup of caffeine daily  Exercise - No structured exercise  2) Preventative Exams / Immunizations:  Dental -- Due for exam   Vision -- Due for exam   Health Maintenance  Topic Date Due  . HIV Screening  06/25/2004  . TETANUS/TDAP  06/25/2008  . INFLUENZA VACCINE  10/04/2014  TDap   There is no immunization history on file for this patient.  No Known Allergies   No current outpatient prescriptions on file prior to visit.   No current facility-administered medications on file prior to visit.    Past Medical History  Diagnosis Date  . Blood in stool   . Chicken pox   . GERD (gastroesophageal reflux disease)   . Migraine     Past Surgical History  Procedure Laterality Date  . Hernia repair      Family History  Problem Relation Age of Onset  . Diabetes Mother   . Heart disease Mother   . Hypertension Mother   . Diabetes Father   . Diabetes Sister   . Hypertension Sister   . Breast cancer Maternal Grandmother   . Breast cancer Paternal Grandmother     History   Social History  . Marital Status: Single    Spouse Name: N/A  . Number of Children: 1  . Years of Education: 14   Occupational History  . Buyer, retail    Social History Main Topics  . Smoking status: Current Some Day Smoker -- 0.01 packs/day for 10 years    Types: Cigarettes, Cigars  . Smokeless tobacco: Never Used  . Alcohol Use: 1.2 oz/week    1 Cans of beer, 1 Shots of liquor, 0 Standard drinks or equivalent per week  . Drug Use: No  . Sexual Activity: Not on file   Other Topics Concern  . Not on file   Social History Narrative     Born and raised in [place], [place]. Raised in [place] and [place]. Currently resides in a townhome by himself. 1 fish. Fun: Spend time with daughter, play basketball, sleep   Denies religious beliefs that would effect health care.     Review of Systems  Constitutional: Denies fever, chills, fatigue, or significant weight gain/loss. HENT: Head: Denies headache or neck pain Ears: Denies changes in hearing, ringing in ears, earache, drainage Nose: Denies discharge, stuffiness, itching, nosebleed, sinus pain Occasional allergies Throat: Denies sore throat, hoarseness, dry mouth, sores, thrush Eyes: Denies loss/changes in vision, pain, redness, blurry/double vision, flashing lights Cardiovascular: Denies chest pain/discomfort, tightness, palpitations, shortness of breath with activity, difficulty lying down, swelling, sudden awakening with shortness of breath Respiratory: Denies shortness of breath, cough, sputum production, wheezing Gastrointestinal: Denies dysphasia, heartburn, change in appetite, nausea, change in bowel habits, rectal bleeding, constipation, diarrhea, yellow skin or eyes Genitourinary: Denies frequency, urgency, burning/pain, blood in urine, incontinence, change in urinary strength. Musculoskeletal: Denies muscle/joint pain, stiffness, back pain, redness or swelling of joints, trauma Skin: Denies rashes, lumps, itching, dryness, color changes, or hair/nail changes Neurological: Denies dizziness, fainting, seizures, weakness, numbness, tingling, tremor Psychiatric -  Denies nervousness, stress, depression or memory loss Endocrine: Denies heat or cold intolerance, sweating, frequent urination, excessive thirst, changes in appetite Hematologic: Denies ease of bruising or bleeding     Objective:    BP 118/86 mmHg  Pulse 67  Temp(Src) 97.7 F (36.5 C) (Oral)  Resp 18  Ht 5\' 8"  (1.727 m)  Wt 141 lb 12.8 oz (64.32 kg)  BMI 21.57 kg/m2  SpO2 97% Nursing note and  vital signs reviewed.  Physical Exam  Constitutional: He is oriented to person, place, and time. He appears well-developed and well-nourished.  HENT:  Head: Normocephalic.  Right Ear: Hearing, tympanic membrane, external ear and ear canal normal.  Left Ear: Hearing, tympanic membrane, external ear and ear canal normal.  Nose: Nose normal.  Mouth/Throat: Uvula is midline, oropharynx is clear and moist and mucous membranes are normal.  Eyes: Conjunctivae and EOM are normal. Pupils are equal, round, and reactive to light.  Neck: Neck supple. No JVD present. No tracheal deviation present. No thyromegaly present.  Cardiovascular: Normal rate, regular rhythm, normal heart sounds and intact distal pulses.   Pulmonary/Chest: Effort normal and breath sounds normal.  Abdominal: Soft. Bowel sounds are normal. He exhibits no distension and no mass. There is no tenderness. There is no rebound and no guarding.  Musculoskeletal: Normal range of motion. He exhibits no edema or tenderness.  Lymphadenopathy:    He has no cervical adenopathy.  Neurological: He is alert and oriented to person, place, and time. He has normal reflexes. No cranial nerve deficit. He exhibits normal muscle tone. Coordination normal.  Skin: Skin is warm and dry.  Psychiatric: He has a normal mood and affect. His behavior is normal. Judgment and thought content normal.       Assessment & Plan:

## 2014-06-18 NOTE — Assessment & Plan Note (Addendum)
1) Anticipatory Guidance: Discussed importance of wearing a seatbelt while driving and not texting while driving; changing batteries in smoke detector at least once annually; wearing suntan lotion when outside; eating a balanced and moderate diet; getting physical activity at least 30 minutes per day.  2) Immunizations / Screenings / Labs:  Patient is due for a tetanus shot. TDap given in office today. All other immunizations are up-to-date per recommendations. Patient is due for vision and dental screenings which he will set up independently. Obtain CBC, BMET, Lipid profile, B12 and TSH.   Overall well exam. Major cardiovascular risk factors include tobacco use despite rarely. Discussed importance of smoking cessation for his overall health. Otherwise BMI indicates normal weight. Patient is sedentary with the exception of work. Discussed importance of increasing physical activity to 30 minutes most days of the week. Follow up prevention exam in one year. Office visit pending blood work. Patient requests refill of mupirocin which was previously prescribed.

## 2014-06-18 NOTE — Addendum Note (Signed)
Addended by: Mercer PodWRENN, [place] on: 06/18/2014 10:28 AM   Modules accepted: Orders

## 2014-06-18 NOTE — Patient Instructions (Addendum)
Thank you for choosing [facility].  Summary/Instructions:  For your ears - Murine ear wax kit or  To prevent wax buildup within the ear:   Use equal parts of water and white vinegar  Soak a cotton ball in the solution and place several drops within the ear  Insert cotton ball in external ear canal and let sit for 30 minutes prior to shower  Remove cotton ball and gently irrigate the ear canal in the shower.  Do not irrigate directly into the ear but rather let it hit the external canal and irrigate.  For maintenance, this can be done 1 time weekly.   Please stop by the lab on the basement level of the building for your blood work. Your results will be released to [place] (or called to you) after review, usually within 72 hours after test completion. If any changes need to be made, you will be notified at that same time.  Health Maintenance A healthy lifestyle and preventative care can promote health and wellness. 2. Maintain regular health, dental, and eye exams. 3. Eat a healthy diet. Foods like vegetables, fruits, whole grains, low-fat dairy products, and lean protein foods contain the nutrients you need and are low in calories. Decrease your intake of foods high in solid fats, added sugars, and salt. Get information about a proper diet from your health care provider, if necessary. 4. Regular physical exercise is one of the most important things you can do for your health. Most adults should get at least 150 minutes of moderate-intensity exercise (any activity that increases your heart rate and causes you to sweat) each week. In addition, most adults need muscle-strengthening exercises on 2 or more days a week.  5. Maintain a healthy weight. The body mass index (BMI) is a screening tool to identify possible weight problems. It provides an estimate of body fat based on height and weight. Your health care provider can find your BMI and can help you achieve or maintain a healthy  weight. For males 20 years and older: 1. A BMI below 18.5 is considered underweight. 2. A BMI of 18.5 to 24.9 is normal. 3. A BMI of 25 to 29.9 is considered overweight. 4. A BMI of 30 and above is considered obese. 6. Maintain normal blood lipids and cholesterol by exercising and minimizing your intake of saturated fat. Eat a balanced diet with plenty of fruits and vegetables. Blood tests for lipids and cholesterol should begin at age 57 and be repeated every 5 years. If your lipid or cholesterol levels are high, you are over age 56, or you are at high risk for heart disease, you may need your cholesterol levels checked more frequently.Ongoing high lipid and cholesterol levels should be treated with medicines if diet and exercise are not working. 7. If you smoke, find out from your health care provider how to quit. If you do not use tobacco, do not start. 8. Lung cancer screening is recommended for adults aged 39-80 years who are at high risk for developing lung cancer because of a history of smoking. A yearly low-dose CT scan of the lungs is recommended for people who have at least a 30-pack-year history of smoking and are current smokers or have quit within the past 15 years. A pack year of smoking is smoking an average of 1 pack of cigarettes a day for 1 year (for example, a 30-pack-year history of smoking could mean smoking 1 pack a day for 30 years or 2 packs  a day for 15 years). Yearly screening should continue until the smoker has stopped smoking for at least 15 years. Yearly screening should be stopped for people who develop a health problem that would prevent them from having lung cancer treatment. 9. If you choose to drink alcohol, do not have more than 2 drinks per day. One drink is considered to be 12 oz (360 mL) of beer, 5 oz (150 mL) of wine, or 1.5 oz (45 mL) of liquor. 10. Avoid the use of street drugs. Do not share needles with anyone. Ask for help if you need support or instructions  about stopping the use of drugs. 11. High blood pressure causes heart disease and increases the risk of stroke. Blood pressure should be checked at least every 1-2 years. Ongoing high blood pressure should be treated with medicines if weight loss and exercise are not effective. 24. If you are 10-26 years old, ask your health care provider if you should take aspirin to prevent heart disease. 13. Diabetes screening involves taking a blood sample to check your fasting blood sugar level. This should be done once every 3 years after age 50 if you are at a normal weight and without risk factors for diabetes. Testing should be considered at a younger age or be carried out more frequently if you are overweight and have at least 1 risk factor for diabetes. 14. Colorectal cancer can be detected and often prevented. Most routine colorectal cancer screening begins at the age of 39 and continues through age 19. However, your health care provider may recommend screening at an earlier age if you have risk factors for colon cancer. On a yearly basis, your health care provider may provide home test kits to check for hidden blood in the stool. A small camera at the end of a tube may be used to directly examine the colon (sigmoidoscopy or colonoscopy) to detect the earliest forms of colorectal cancer. Talk to your health care provider about this at age 75 when routine screening begins. A direct exam of the colon should be repeated every 5-10 years through age 35, unless early forms of precancerous polyps or small growths are found. 40. People who are at an increased risk for hepatitis B should be screened for this virus. You are considered at high risk for hepatitis B if: 1. You were born in a country where hepatitis B occurs often. Talk with your health care provider about which countries are considered high risk. 2. Your parents were born in a high-risk country and you have not received a shot to protect against hepatitis B  (hepatitis B vaccine). 3. You have HIV or AIDS. 4. You use needles to inject street drugs. 5. You live with, or have sex with, someone who has hepatitis B. 67. You are a man who has sex with other men (MSM). 7. You get hemodialysis treatment. 8. You take certain medicines for conditions like cancer, organ transplantation, and autoimmune conditions. 16. Hepatitis C blood testing is recommended for all people born from [birth year] through 1966 and any individual with known risk factors for hepatitis C. 68. Healthy men should no longer receive prostate-specific antigen (PSA) blood tests as part of routine cancer screening. Talk to your health care provider about prostate cancer screening. 18. Testicular cancer screening is not recommended for adolescents or adult males who have no symptoms. Screening includes self-exam, a health care provider exam, and other screening tests. Consult with your health care provider about any symptoms you have  or any concerns you have about testicular cancer. 35. Practice safe sex. Use condoms and avoid high-risk sexual practices to reduce the spread of sexually transmitted infections (STIs). 7. You should be screened for STIs, including gonorrhea and chlamydia if: 1. You are sexually active and are younger than 24 years. 2. You are older than 24 years, and your health care provider tells you that you are at risk for this type of infection. 3. Your sexual activity has changed since you were last screened, and you are at an increased risk for chlamydia or gonorrhea. Ask your health care provider if you are at risk. 21. If you are at risk of being infected with HIV, it is recommended that you take a prescription medicine daily to prevent HIV infection. This is called pre-exposure prophylaxis (PrEP). You are considered at risk if: 1. You are a man who has sex with other men (MSM). 2. You are a heterosexual man who is sexually active with multiple partners. 3. You take drugs by  injection. 4. You are sexually active with a partner who has HIV. 5. Talk with your health care provider about whether you are at high risk of being infected with HIV. If you choose to begin PrEP, you should first be tested for HIV. You should then be tested every 3 months for as long as you are taking PrEP. 22. Use sunscreen. Apply sunscreen liberally and repeatedly throughout the day. You should seek shade when your shadow is shorter than you. Protect yourself by wearing long sleeves, pants, a wide-brimmed hat, and sunglasses year round whenever you are outdoors. 23. Tell your health care provider of new moles or changes in moles, especially if there is a change in shape or color. Also, tell your health care provider if a mole is larger than the size of a pencil eraser. 24. A one-time screening for abdominal aortic aneurysm (AAA) and surgical repair of large AAAs by ultrasound is recommended for men aged 53-75 years who are current or former smokers. 25. Stay current with your vaccines (immunizations). Document Released: 08/18/2007 Document Revised: 02/24/2013 Document Reviewed: 07/17/2010 [facility] Patient Information 2015 [place], [place]. This information is not intended to replace advice given to you by your health care provider. Make sure you discuss any questions you have with your health care provider.

## 2014-06-18 NOTE — Progress Notes (Signed)
Pre visit review using our clinic review tool, if applicable. No additional management support is needed unless otherwise documented below in the visit note. 

## 2014-06-21 ENCOUNTER — Telehealth: Payer: Self-pay | Admitting: Family

## 2014-06-21 NOTE — Telephone Encounter (Signed)
Please inform patient his lab results show his white/red blood cells, thyroid, kidney function and B12 are all within normal ranges. His fasting blood sugars slightly elevated indicating potential prediabetes. And his HDL or good cholesterol is slightly low at 37 with a goal greater than 39. Both of these can be accomplished through increasing nutrient density of his foods meaning fruits and vegetables and decreasing saturated fats. This can also be accomplished through increasing physical activity to 30 minutes most days of the week. Follow-up prevention exam in one year.

## 2014-06-21 NOTE — Telephone Encounter (Signed)
Sending results in the mail. 

## 2014-10-27 ENCOUNTER — Ambulatory Visit (INDEPENDENT_AMBULATORY_CARE_PROVIDER_SITE_OTHER): Payer: BLUE CROSS/BLUE SHIELD | Admitting: Family

## 2014-10-27 ENCOUNTER — Encounter: Payer: Self-pay | Admitting: Family

## 2014-10-27 VITALS — BP 112/72 | HR 87 | Temp 99.0°F | Resp 18

## 2014-10-27 DIAGNOSIS — K130 Diseases of lips: Secondary | ICD-10-CM | POA: Diagnosis not present

## 2014-10-27 DIAGNOSIS — T7840XA Allergy, unspecified, initial encounter: Secondary | ICD-10-CM

## 2014-10-27 DIAGNOSIS — R220 Localized swelling, mass and lump, head: Secondary | ICD-10-CM | POA: Insufficient documentation

## 2014-10-27 MED ORDER — METHYLPREDNISOLONE ACETATE 80 MG/ML IJ SUSP
80.0000 mg | Freq: Once | INTRAMUSCULAR | Status: AC
  Administered 2014-10-27: 80 mg via INTRAMUSCULAR

## 2014-10-27 MED ORDER — AMOXICILLIN 500 MG PO CAPS: 500.0000 mg | ORAL_CAPSULE | Freq: Two times a day (BID) | ORAL | Status: DC

## 2014-10-27 MED ORDER — PREDNISONE 10 MG PO TABS: ORAL_TABLET | ORAL | Status: DC

## 2014-10-27 NOTE — Assessment & Plan Note (Signed)
Swelling of the lip of questionable source being allergic or infection. In office depomedrol provided. Start prednisone taper. Start amoxicillin to cover for infection. Follow up with dentistry or if symptoms worsen or fail to improve.

## 2014-10-27 NOTE — Patient Instructions (Signed)
Thank you for choosing [facility].  Summary/Instructions:  Your prescription(s) have been submitted to your pharmacy or been printed and provided for you. Please take as directed and contact our office if you believe you are having problem(s) with the medication(s) or have any questions.  If your symptoms worsen or fail to improve, please contact our office for further instruction, or in case of emergency go directly to the emergency room at the closest medical facility.   6 Day Prednisone Taper Instructions:   Day 1: Two tablets before breakfast, one after lunch, one after dinner, and two at bedtime.  Day 2: One tablet before breakfast, one after lunch, one after dinner, and two at bedtime Day 3: One tablet before breakfast, one after lunch, one after dinner, and one at bedtime Day 4: One tablet before breakfast, one after lunch, and one at bedtime Day 5: One tablet before breakfast and one at bedtime Day 6: One tablet before breakfast  

## 2014-10-27 NOTE — Progress Notes (Signed)
Pre visit review using our clinic review tool, if applicable. No additional management support is needed unless otherwise documented below in the visit note. 

## 2014-10-27 NOTE — Progress Notes (Signed)
   Subjective:    Patient ID: Darius Espinoza, male    DOB: [birth year], 24 y.o.   MRN: [id number]  Chief Complaint  Patient presents with  . Facial Swelling    woke up this morning with his upper lip swollen    HPI:  Darius Espinoza is a 24 y.o. male with a PMH of generalized headaches who presents today for an acute office visit.   This is a new problem. Associated symptom of swelling located in his lips has been going on since this morning. Scheduled for dental visit and was having dental pain when he took a previously prescribed dose of a pain medication yesterday. When he awoke this morning his upper lip had become swollen and has progressively worsened since. Denies any modifying treatments to make his lip better. Denies tongue swelling, shortness of breath, or difficulty breathing.   No Known Allergies  Current Outpatient Prescriptions on File Prior to Visit  Medication Sig Dispense Refill  . mupirocin cream (BACTROBAN) 2 % Apply 1 application topically 2 (two) times daily. 15 g 0   No current facility-administered medications on file prior to visit.    Review of Systems  Constitutional: Negative for fever and chills.  HENT: Positive for dental problem.        Positive for lip swelling  Respiratory: Negative for cough, choking, chest tightness and shortness of breath.   Cardiovascular: Negative for chest pain, palpitations and leg swelling.      Objective:    BP 112/72 mmHg  Pulse 87  Temp(Src) 99 F (37.2 C) (Oral)  Resp 18  SpO2 97% Nursing note and vital signs reviewed.  Physical Exam  Constitutional: He is oriented to person, place, and time. He appears well-developed and well-nourished. No distress.  HENT:  Obvious lip swelling on upper right side noted. Teeth underneath appear damaged and possible root exposure. There is edema noted inside his mouth with no obvious discharge.   Cardiovascular: Normal rate, regular rhythm, normal heart sounds and intact distal  pulses.   Pulmonary/Chest: Effort normal and breath sounds normal.  Neurological: He is alert and oriented to person, place, and time.  Skin: Skin is warm and dry.  Psychiatric: He has a normal mood and affect. His behavior is normal. Judgment and thought content normal.       Assessment & Plan:   Problem List Items Addressed This Visit      Other   Lip swelling - Primary    Swelling of the lip of questionable source being allergic or infection. In office depomedrol provided. Start prednisone taper. Start amoxicillin to cover for infection. Follow up with dentistry or if symptoms worsen or fail to improve.       Relevant Medications   methylPREDNISolone acetate (DEPO-MEDROL) injection 80 mg (Completed)   predniSONE (DELTASONE) 10 MG tablet   amoxicillin (AMOXIL) 500 MG capsule

## 2014-10-28 ENCOUNTER — Emergency Department (HOSPITAL_COMMUNITY)
Admission: EM | Admit: 2014-10-28 | Discharge: 2014-10-28 | Disposition: A | Discharge status: Home / Self Care | Payer: BLUE CROSS/BLUE SHIELD | Attending: Emergency Medicine | Admitting: Emergency Medicine

## 2014-10-28 ENCOUNTER — Encounter (HOSPITAL_COMMUNITY): Payer: Self-pay | Admitting: Emergency Medicine

## 2014-10-28 DIAGNOSIS — Z8619 Personal history of other infectious and parasitic diseases: Secondary | ICD-10-CM | POA: Diagnosis not present

## 2014-10-28 DIAGNOSIS — R600 Localized edema: Secondary | ICD-10-CM | POA: Diagnosis not present

## 2014-10-28 DIAGNOSIS — R220 Localized swelling, mass and lump, head: Secondary | ICD-10-CM

## 2014-10-28 DIAGNOSIS — Z87891 Personal history of nicotine dependence: Secondary | ICD-10-CM | POA: Diagnosis not present

## 2014-10-28 DIAGNOSIS — K029 Dental caries, unspecified: Secondary | ICD-10-CM | POA: Diagnosis not present

## 2014-10-28 DIAGNOSIS — K047 Periapical abscess without sinus: Secondary | ICD-10-CM | POA: Insufficient documentation

## 2014-10-28 DIAGNOSIS — Z8679 Personal history of other diseases of the circulatory system: Secondary | ICD-10-CM | POA: Diagnosis not present

## 2014-10-28 DIAGNOSIS — K088 Other specified disorders of teeth and supporting structures: Secondary | ICD-10-CM | POA: Insufficient documentation

## 2014-10-28 DIAGNOSIS — K0889 Other specified disorders of teeth and supporting structures: Secondary | ICD-10-CM

## 2014-10-28 MED ORDER — AMOXICILLIN-POT CLAVULANATE 875-125 MG PO TABS
1.0000 | ORAL_TABLET | Freq: Two times a day (BID) | ORAL | Status: DC
Start: 2014-10-28 — End: 2016-12-19

## 2014-10-28 MED ORDER — LIDOCAINE VISCOUS 2 % MT SOLN
15.0000 mL | Freq: Once | OROMUCOSAL | Status: AC
  Administered 2014-10-28: 15 mL via OROMUCOSAL
  Filled 2014-10-28: qty 15

## 2014-10-28 MED ORDER — AMOXICILLIN-POT CLAVULANATE 875-125 MG PO TABS
1.0000 | ORAL_TABLET | Freq: Once | ORAL | Status: AC
  Administered 2014-10-28: 1 via ORAL
  Filled 2014-10-28: qty 1

## 2014-10-28 MED ORDER — LIDOCAINE VISCOUS 2 % MT SOLN: 20.0000 mL | OROMUCOSAL | Status: DC | PRN

## 2014-10-28 NOTE — ED Notes (Signed)
Pt had upper gum pain 2 days ago. Woke yesterday am with upper lip swelling. Went to PCP, got "a shot" and Rx for Amoxicillin and Prednisone. No improvement today. Denies pain except if "you mash it". Denies SOB or N/V.

## 2014-10-28 NOTE — ED Provider Notes (Signed)
CSN: [id number]     Arrival date & time 10/28/14  0930 History  This chart was scribed for non-physician practitioner Danelle Berry, PA-C working with Alvira Monday, MD by Littie Deeds, ED Scribe. This patient was seen in room TR06C/TR06C and the patient's care was started at 10:08 AM.       Chief Complaint  Patient presents with  . Facial Swelling   The history is provided by the patient. No language interpreter was used.   HPI Comments: Darius Espinoza is a 24 y.o. male who presents to the Emergency Department complaining of gradual onset, right upper lip swelling that started yesterday morning. He has had associated mild right upper gum pain for the past 2-3 days. The pain is worse with eating. He also had a fever of 102.2 F last night. He did not take anything for the fever. Patient was seen yesterday by his PCP and was started on amoxicillin and prednisone to cover infection and allergic reaction. The swelling has not improved with the medications, so he was told to come here today. Patient denies nausea and vomiting. He did speak to a dentist at [facility], who had told him to first see his PCP yesterday. He does not remember the last time he saw a dentist.  He does have a history of poor dental health, multiple decayed/missing teeth, and abscesses in his mouth that have spontaneously drained, or that the pt has helped to drain by applying pressure and "popping,"   Past Medical History  Diagnosis Date  . Blood in stool   . Chicken pox   . GERD (gastroesophageal reflux disease)   . Migraine    Past Surgical History  Procedure Laterality Date  . Hernia repair     Family History  Problem Relation Age of Onset  . Diabetes Mother   . Heart disease Mother   . Hypertension Mother   . Diabetes Father   . Diabetes Sister   . Hypertension Sister   . Breast cancer Maternal Grandmother   . Breast cancer Paternal Grandmother    Social History  Substance Use Topics  . Smoking  status: Former Smoker -- 0.01 packs/day for 10 years    Types: Cigarettes, Cigars  . Smokeless tobacco: Never Used  . Alcohol Use: 1.2 oz/week    1 Cans of beer, 1 Shots of liquor, 0 Standard drinks or equivalent per week    Review of Systems  Constitutional: Positive for fever. Negative for diaphoresis, activity change and appetite change.  HENT: Positive for facial swelling. Negative for congestion and trouble swallowing.   Eyes: Negative.   Respiratory: Negative for cough, choking, chest tightness, shortness of breath and stridor.   Cardiovascular: Negative for chest pain.  Gastrointestinal: Negative for nausea, vomiting and abdominal distention.  Genitourinary: Negative.   Skin: Negative.   Neurological: Negative.   Psychiatric/Behavioral: Negative.     Allergies  Review of patient's allergies indicates no known allergies.  Home Medications   Prior to Admission medications   Medication Sig Start Date End Date Taking? Authorizing Provider  amoxicillin (AMOXIL) 500 MG capsule Take 1 capsule (500 mg total) by mouth 2 (two) times daily. 10/27/14   Veryl Speak, FNP  mupirocin cream (BACTROBAN) 2 % Apply 1 application topically 2 (two) times daily. 06/18/14   Veryl Speak, FNP  predniSONE (DELTASONE) 10 MG tablet Take 6 tablets x 1 day, 5 tablets x 1 day, 4 tablets x 1 day, 3 tablets x 1 day, 2  tablets x 1 day, 1 tablet x 1 day 10/27/14   Veryl Speak, FNP   BP 123/83 mmHg  Pulse 87  Temp(Src) 98.7 F (37.1 C) (Oral)  Resp 16  Ht 5\' 8"  (1.727 m)  Wt 135 lb (61.236 kg)  BMI 20.53 kg/m2  SpO2 97% Physical Exam  Constitutional: He is oriented to person, place, and time. He appears well-developed and well-nourished. No distress.  HENT:  Head: Normocephalic and atraumatic.  Right Ear: External ear normal.  Left Ear: External ear normal.  Nose: Nose normal.  Mouth/Throat: Uvula is midline, oropharynx is clear and moist and mucous membranes are normal. Mucous membranes  are not pale, not dry and not cyanotic. No trismus in the jaw. Abnormal dentition. Dental abscesses and dental caries present. No uvula swelling. No oropharyngeal exudate, posterior oropharyngeal edema or posterior oropharyngeal erythema.    No trismus, no sublingual tenderness Swelling of superior lip from midline to left, tender to palpation Poor dentition, multiple missing and decaying teeth   Eyes: Conjunctivae and EOM are normal. Pupils are equal, round, and reactive to light. Right eye exhibits no discharge. Left eye exhibits no discharge. No scleral icterus.  Neck: Normal range of motion. Neck supple. No JVD present. No tracheal deviation present.  Cardiovascular: Normal rate and regular rhythm.   Pulmonary/Chest: Effort normal and breath sounds normal. No stridor. No respiratory distress.  Musculoskeletal: Normal range of motion. He exhibits no edema.  Lymphadenopathy:    He has no cervical adenopathy.  Neurological: He is alert and oriented to person, place, and time. He exhibits normal muscle tone. Coordination normal.  Skin: Skin is warm and dry. No rash noted. He is not diaphoretic. No erythema. No pallor.  Psychiatric: He has a normal mood and affect. His behavior is normal. Judgment and thought content normal.    ED Course  Procedures  DIAGNOSTIC STUDIES: Oxygen Saturation is 97% on room air, normal by my interpretation.    COORDINATION OF CARE: 10:17 AM-Discussed treatment plan which includes I&D patient at bedside and patient/guardian agreed to plan.   INCISION AND DRAINAGE Performed by: Danelle Berry Consent: Verbal consent obtained. Risks and benefits: risks, benefits and alternatives were discussed Time out performed prior to procedure Type: periapical abscess Body area: periapical abscess of tooth #6 - alveolar gingiva  Anesthesia: topical Incision was made with a scalpel. Local anesthetic: viscous lidocaine Anesthetic total: 5mL - applied topically and spit  out Complexity: complex Manual compression to express purulent discharge Drainage: purulent Drainage amount: copious Packing material: n/a Patient tolerance: Patient tolerated the procedure well with no immediate complications.  Labs Review Labs Reviewed - No data to display  Imaging Review No results found. I have personally reviewed and evaluated these images and lab results as part of my medical decision-making.   EKG Interpretation None      MDM   Final diagnoses:  None    Pt with facial swelling, upper left side of lip is swollen extending into his cheek.  He has a decayed tooth at the same place that is painful.  He was being treated for possible allergic reaction and for infection - despite both treatments his pain and swelling have progressed.  On exam pt has visible periapical abscess of tooth # 6 - upper right canine Pt was numbed with topical lidocaine gel and I&D successful - copious amount of drainage PT was given augmentin, he declined pain medication, tolerated procedure well.  He agreed to call the dentist back today  to get an appointment.  He was also given dental resources.  His vital signs have been stable - is afebrile, without tachycardia, and did not have any other constitutional sx,  No neck pain. No difficulty breathing. Tolerating secretions well. I believe he will improve after I&D as long as he is compliant with abx and with dental follow-up and treatment.  Filed Vitals:   10/28/14 0943 10/28/14 1114  BP: 123/83 126/82  Pulse: 87 80  Temp: 98.7 F (37.1 C) 98.8 F (37.1 C)  TempSrc: Oral Oral  Resp: 16 16  Height:  (1.727 m)   Weight: 135 lb (61.236 kg)   SpO2: 97% 100%     Medications  amoxicillin-clavulanate (AUGMENTIN) 875-125 MG per tablet 1 tablet (1 tablet Oral Given 10/28/14 1025)  lidocaine (XYLOCAINE) 2 % viscous mouth solution 15 mL (15 mLs Mouth/Throat Given 10/28/14 1025)   I personally performed the services described in  this documentation, which was scribed in my presence. The recorded information has been reviewed and is accurate.    Danelle Berry, PA-C 11/01/14 1610  Alvira Monday, MD 11/01/14 [phone]

## 2014-10-28 NOTE — Discharge Instructions (Signed)
Please see your dentist as soon as possible for treatment. You have had a periapical abscess drained today. We have changed her medicine from amoxicillin to Augmentin, please take the antibiotic as prescribed.    Abscessed Tooth An abscessed tooth is an infection around your tooth. It may be caused by holes or damage to the tooth (cavity) or a dental disease. An abscessed tooth causes mild to very bad pain in and around the tooth. See your dentist right away if you have tooth or gum pain. HOME CARE  Take your medicine as told. Finish it even if you start to feel better.  Do not drive after taking pain medicine.  Rinse your mouth (gargle) often with salt water ( teaspoon salt in 8 ounces of warm water).  Do not apply heat to the outside of your face. GET HELP RIGHT AWAY IF:   You have a temperature by mouth above 102 F (38.9 C), not controlled by medicine.  You have chills and a very bad headache.  You have problems breathing or swallowing.  Your mouth will not open.  You develop puffiness (swelling) on the neck or around the eye.  Your pain is not helped by medicine.  Your pain is getting worse instead of better. MAKE SURE YOU:   Understand these instructions.  Will watch your condition.  Will get help right away if you are not doing well or get worse. Document Released: 08/08/2007 Document Revised: 05/14/2011 Document Reviewed: 05/30/2010 [facility] Patient Information 2015 [place], [place]. This information is not intended to replace advice given to you by your health care provider. Make sure you discuss any questions you have with your health care provider.

## 2015-03-11 ENCOUNTER — Telehealth: Payer: Self-pay | Admitting: *Deleted

## 2015-03-11 MED ORDER — LIDOCAINE VISCOUS 2 % MT SOLN: 15.0000 mL | OROMUCOSAL | Status: DC | PRN

## 2015-03-11 NOTE — Telephone Encounter (Signed)
Medication sent to pharmacy  

## 2015-03-11 NOTE — Telephone Encounter (Signed)
Pt left msg on triage stating wanting to see if Tammy SoursGreg would send a rx for Lidocaine Sol. Was given to him at the ER but he never got filled he has an abcess and was told to use. He has made appt 1/17...Raechel Chute/lmb

## 2015-03-11 NOTE — Telephone Encounter (Signed)
Notified pt Darius Espinoza sent rx to [facility].../lmb 

## 2015-03-12 ENCOUNTER — Emergency Department (HOSPITAL_COMMUNITY)
Admission: EM | Admit: 2015-03-12 | Discharge: 2015-03-12 | Disposition: A | Discharge status: Home / Self Care | Payer: BLUE CROSS/BLUE SHIELD | Attending: Emergency Medicine | Admitting: Emergency Medicine

## 2015-03-12 ENCOUNTER — Encounter (HOSPITAL_COMMUNITY): Payer: Self-pay | Admitting: *Deleted

## 2015-03-12 DIAGNOSIS — Z8719 Personal history of other diseases of the digestive system: Secondary | ICD-10-CM | POA: Insufficient documentation

## 2015-03-12 DIAGNOSIS — K029 Dental caries, unspecified: Secondary | ICD-10-CM | POA: Insufficient documentation

## 2015-03-12 DIAGNOSIS — Z87891 Personal history of nicotine dependence: Secondary | ICD-10-CM | POA: Insufficient documentation

## 2015-03-12 DIAGNOSIS — Z792 Long term (current) use of antibiotics: Secondary | ICD-10-CM | POA: Diagnosis not present

## 2015-03-12 DIAGNOSIS — Z8679 Personal history of other diseases of the circulatory system: Secondary | ICD-10-CM | POA: Diagnosis not present

## 2015-03-12 DIAGNOSIS — Z8619 Personal history of other infectious and parasitic diseases: Secondary | ICD-10-CM | POA: Diagnosis not present

## 2015-03-12 DIAGNOSIS — K047 Periapical abscess without sinus: Secondary | ICD-10-CM | POA: Diagnosis present

## 2015-03-12 MED ORDER — TRAMADOL HCL 50 MG PO TABS: 50.0000 mg | ORAL_TABLET | Freq: Four times a day (QID) | ORAL | Status: DC | PRN

## 2015-03-12 MED ORDER — TRAMADOL HCL 50 MG PO TABS
50.0000 mg | ORAL_TABLET | Freq: Once | ORAL | Status: AC
  Administered 2015-03-12: 50 mg via ORAL
  Filled 2015-03-12: qty 1

## 2015-03-12 MED ORDER — PENICILLIN V POTASSIUM 500 MG PO TABS: 500.0000 mg | ORAL_TABLET | Freq: Four times a day (QID) | ORAL | Status: DC

## 2015-03-12 MED ORDER — PENICILLIN V POTASSIUM 500 MG PO TABS
500.0000 mg | ORAL_TABLET | Freq: Once | ORAL | Status: AC
  Administered 2015-03-12: 500 mg via ORAL
  Filled 2015-03-12: qty 1

## 2015-03-12 NOTE — ED Provider Notes (Signed)
CSN: [id number]     Arrival date & time 03/12/15  0302 History   None    Chief Complaint  Patient presents with  . Abscess     (Consider location/radiation/quality/duration/timing/severity/associated sxs/prior Treatment) HPI Comments: This is 25 year old pale with extensive dental decay, presents with pain and swelling to the left upper gingiva over the canine tooth.  Has not taken any medication for discomfort.  Has not seen a dentist  Patient is a 25 y.o. male presenting with abscess. The history is provided by the patient.  Abscess Location:  Mouth Mouth abscess location:  Upper gingiva Abscess quality: induration, painful and redness   Abscess quality: not draining and no fluctuance   Progression:  Worsening Pain details:    Quality:  Throbbing   Severity:  Moderate   Timing:  Constant   Progression:  Worsening Chronicity:  Recurrent Relieved by:  Nothing Worsened by:  Nothing tried Associated symptoms: no fever and no headaches     Past Medical History  Diagnosis Date  . Blood in stool   . Chicken pox   . GERD (gastroesophageal reflux disease)   . Migraine    Past Surgical History  Procedure Laterality Date  . Hernia repair     Family History  Problem Relation Age of Onset  . Diabetes Mother   . Heart disease Mother   . Hypertension Mother   . Diabetes Father   . Diabetes Sister   . Hypertension Sister   . Breast cancer Maternal Grandmother   . Breast cancer Paternal Grandmother    Social History  Substance Use Topics  . Smoking status: Former Smoker -- 0.01 packs/day for 10 years    Types: Cigarettes, Cigars  . Smokeless tobacco: Never Used  . Alcohol Use: 1.2 oz/week    1 Cans of beer, 1 Shots of liquor, 0 Standard drinks or equivalent per week     Comment: socially    Review of Systems  Constitutional: Negative for fever.  Neurological: Negative for headaches.  All other systems reviewed and are negative.     Allergies  Review of patient's  allergies indicates no known allergies.  Home Medications   Prior to Admission medications   Medication Sig Start Date End Date Taking? Authorizing Provider  amoxicillin (AMOXIL) 500 MG capsule Take 1 capsule (500 mg total) by mouth 2 (two) times daily. 10/27/14   Veryl Speak, FNP  amoxicillin-clavulanate (AUGMENTIN) 875-125 MG per tablet Take 1 tablet by mouth every 12 (twelve) hours. 10/28/14   Danelle Berry, PA-C  lidocaine (XYLOCAINE) 2 % solution Use as directed 15 mLs in the mouth or throat as needed for mouth pain. 03/11/15   Veryl Speak, FNP  mupirocin cream (BACTROBAN) 2 % Apply 1 application topically 2 (two) times daily. 06/18/14   Veryl Speak, FNP  penicillin v potassium (VEETID) 500 MG tablet Take 1 tablet (500 mg total) by mouth 4 (four) times daily. 03/12/15   Earley Favor, NP  predniSONE (DELTASONE) 10 MG tablet Take 6 tablets x 1 day, 5 tablets x 1 day, 4 tablets x 1 day, 3 tablets x 1 day, 2 tablets x 1 day, 1 tablet x 1 day 10/27/14   Veryl Speak, FNP  traMADol (ULTRAM) 50 MG tablet Take 1 tablet (50 mg total) by mouth every 6 (six) hours as needed. 03/12/15   Earley Favor, NP   BP 130/90 mmHg  Pulse 63  Temp(Src) 98.4 F (36.9 C) (Oral)  Resp 20  SpO2  98% Physical Exam  Constitutional: He is oriented to person, place, and time. He appears well-developed and well-nourished.  HENT:  Head: Normocephalic.  Mouth/Throat:    Eyes: Pupils are equal, round, and reactive to light.  Neck: Normal range of motion.  Cardiovascular: Normal rate.   Pulmonary/Chest: Effort normal.  Musculoskeletal: Normal range of motion.  Lymphadenopathy:    He has no cervical adenopathy.  Neurological: He is alert and oriented to person, place, and time.  Skin: Skin is warm.  Nursing note and vitals reviewed.   ED Course  Procedures (including critical care time) Labs Review Labs Reviewed - No data to display  Imaging Review No results found. I have personally reviewed and  evaluated these images and lab results as part of my medical decision-making.   EKG Interpretation None     Will start Penicillin and Ultram  MDM   Final diagnoses:  Dental abscess         Earley FavorGail Whittley Carandang, NP 03/12/15 [id number]  Gilda Creasehristopher J Pollina, MD 03/12/15 [phone]

## 2015-03-12 NOTE — ED Notes (Signed)
Pt c/o "abscess" to the left side of his face; pt states that it has been there for a week and feels more painful today and feels like it is starting to swell; no redness noted but patient left cheek is fuller than the right cheek

## 2015-03-12 NOTE — Discharge Instructions (Signed)
Dental Abscess °A dental abscess is a collection of pus in or around a tooth. °CAUSES °This condition is caused by a bacterial infection around the root of the tooth that involves the inner part of the tooth (pulp). It may result from: °· Severe tooth decay. °· Trauma to the tooth that allows bacteria to enter into the pulp, such as a broken or chipped tooth. °· Severe gum disease around a tooth. °SYMPTOMS °Symptoms of this condition include: °· Severe pain in and around the infected tooth. °· Swelling and redness around the infected tooth, in the mouth, or in the face. °· Tenderness. °· Pus drainage. °· Bad breath. °· Bitter taste in the mouth. °· Difficulty swallowing. °· Difficulty opening the mouth. °· Nausea. °· Vomiting. °· Chills. °· Swollen neck glands. °· Fever. °DIAGNOSIS °This condition is diagnosed with examination of the infected tooth. During the exam, your dentist may tap on the infected tooth. Your dentist will also ask about your medical and dental history and may order X-rays. °TREATMENT °This condition is treated by eliminating the infection. This may be done with: °· Antibiotic medicine. °· A root canal. This may be performed to save the tooth. °· Pulling (extracting) the tooth. This may also involve draining the abscess. This is done if the tooth cannot be saved. °HOME CARE INSTRUCTIONS °· Take medicines only as directed by your dentist. °· If you were prescribed antibiotic medicine, finish all of it even if you start to feel better. °· Rinse your mouth (gargle) often with salt water to relieve pain or swelling. °· Do not drive or operate heavy machinery while taking pain medicine. °· Do not apply heat to the outside of your mouth. °· Keep all follow-up visits as directed by your dentist. This is important. °SEEK MEDICAL CARE IF: °· Your pain is worse and is not helped by medicine. °SEEK IMMEDIATE MEDICAL CARE IF: °· You have a fever or chills. °· Your symptoms suddenly get worse. °· You have a  very bad headache. °· You have problems breathing or swallowing. °· You have trouble opening your mouth. °· You have swelling in your neck or around your eye. °  °This information is not intended to replace advice given to you by your health care provider. Make sure you discuss any questions you have with your health care provider. °  °Document Released: 02/19/2005 Document Revised: 07/06/2014 Document Reviewed: 02/16/2014 °Elsevier Interactive Patient Education ©2016 Elsevier Inc. ° °Emergency Department Resource Guide °1) Find a Doctor and Pay Out of Pocket °Although you won't have to find out who is covered by your insurance plan, it is a good idea to ask around and get recommendations. You will then need to call the office and see if the doctor you have chosen will accept you as a new patient and what types of options they offer for patients who are self-pay. Some doctors offer discounts or will set up payment plans for their patients who do not have insurance, but you will need to ask so you aren't surprised when you get to your appointment. ° °2) Contact Your Local Health Department °Not all health departments have doctors that can see patients for sick visits, but many do, so it is worth a call to see if yours does. If you don't know where your local health department is, you can check in your phone book. The CDC also has a tool to help you locate your state's health department, and many state websites also have listings   of all of their local health departments. ° °3) Find a Walk-in Clinic °If your illness is not likely to be very severe or complicated, you may want to try a walk in clinic. These are popping up all over the country in pharmacies, drugstores, and shopping centers. They're usually staffed by nurse practitioners or physician assistants that have been trained to treat common illnesses and complaints. They're usually fairly quick and inexpensive. However, if you have serious medical issues or  chronic medical problems, these are probably not your best option. ° °No Primary Care Doctor: °- Call [facility] at  832-8000 - they can help you locate a primary care doctor that  accepts your insurance, provides certain services, etc. °- Physician Referral Service- 1-800-533-3463 ° °Chronic Pain Problems: °Organization         Address  Phone   Notes  °[facility]  (336) 297-2271 Patients need to be referred by their primary care doctor.  ° °Medication Assistance: °Organization         Address  Phone   Notes  °[place] [facility] [address] °[place], [place] [address] (336) 641-8030 --Must be a resident of [place] °-- Must have NO insurance coverage whatsoever (no Medicaid/ Medicare, etc.) °-- The pt. MUST have a primary care doctor that directs their care regularly and follows them in the community °  °[facility]  (866) 331-1348   °[employer]  (888) 892-1162   ° °Agencies that provide inexpensive medical care: °Organization         Address  Phone   Notes  °[facility]  (336) 832-8035   °[facility]    (336) 832-7272   °[facility] [address] °[place], [place] [address] (336) 832-4777   °[facility] [address], °[place] (336) 271-4999   °[facility]    (336) 373-0678   °[facility]    (336) 272-1050   °[facility] ° [address], [place] Phone:  (336) 832-4444, Fax:  (336) 832-4440 Hours of Operation:  9 am - 6 pm, M-F.  Also accepts Medicaid/Medicare and self-pay.  °[facility] ° [address], [place] Phone: (336) [phone], Fax: (336) [phone]. Hours of Operation:  8:30 am - 5:30 pm, M-F.  Also accepts Medicaid and self-pay.  °[facility] [place] [address], [place] Phone: (336) 878-6027   °[facility] [address], [place], [place]  (336)723-1848, Ext. 123 Mondays & Thursdays: 7-9 AM.  First 15 patients are seen on a first come, first serve basis. °  ° °Medicaid-accepting [place] Providers: ° °Organization         Address  Phone   Notes  °[facility] [address], [place] (336) 641-2100 Also accepts self-pay patients.  °[facility] [address], [place] ° (336) 856-9996   °[facility] [address], [place] (336) 288-8857   °[facility] [address] [place] [address], [place] (336) 299-7000   °Veita Bland [address],   ° (336) 373-1557 Only accepts [place] Access Medicaid patients after they have their name applied to their card.  ° °Self-Pay (no insurance) in [place]: ° °Organization         Address  Phone   Notes  °  Sickle Cell Patients, [facility] [address], [place] (336) 832-1970   °[facility] [address], [place] (336) 832-4400   °[facility] ° [address], [place] (336) 992-4800   °[facility]/Dr. Osei-Bonsu ° [address] [place] [address], [place] or [address], [place] (336) 841-8500 Phone number for both [place] and [place] locations is the same.  °[facility] [address], [place] (336) 299-0000   °[facility] [address] [place] [address], [place] or [address] (336) 852-7530 °(336) 878-2260   °[facility] [address], [place] (336) 350-1642, phone; (336) 294-5005, fax Sees patients 1st and 3rd Saturday of every month.  Must not qualify for public or private insurance (i.e. Medicaid, Medicare, [place] Health Choice, Veterans' Benefits) • Household income should be no more than 200% of the poverty level •The clinic cannot treat you if you are pregnant or think you are pregnant • Sexually transmitted  diseases are not treated at the clinic.  ° ° °Dental Care: °Organization         Address  Phone  Notes  °[facility] [address], [place] (336) 641-6152 Accepts children up to age 21 who are enrolled in Medicaid or [place] Health Choice; pregnant women with a Medicaid card; and children who have applied for Medicaid or [place] Health Choice, but were declined, whose parents can pay a reduced fee at time of service.  °[facility] [place]  [address], [place] (336) 641-7733 Accepts children up to age 21 who are enrolled in Medicaid or [place] Health Choice; pregnant women with a Medicaid card; and children who have applied for Medicaid or [place] Health Choice, but were declined, whose parents can pay a reduced fee at time of service.  °[facility] ° [address], [place] (336) 641-4533 Patients are seen by appointment only. Walk-ins are not accepted. [facility] will see patients 18 years of age and older. °Monday - Tuesday (8am-5pm) °Most Wednesdays (8:30-5pm) °$30 per visit, cash only  °[facility] ° [address], [place] (336) 641-4533 Patients are seen by appointment only. Walk-ins are not accepted. [facility] will see patients 18 years of age and older. °One Wednesday Evening (Monthly: Volunteer Based).  $30 per visit, cash only  °[facility]  (919) 537-3737 for adults; Children under age 4, call Graduate Pediatric Dentistry at ([phone]) [phone]. Children aged 4-14, please call (919) [phone] to request a pediatric application. ° Dental services are provided in all areas of dental care including fillings, crowns and bridges, complete and partial dentures, implants, gum treatment, root canals, and extractions. Preventive care is also provided. Treatment is provided to both adults and children. °Patients are selected via a  lottery and there is often a waiting list. °  °[facility] [address], ° ° (336) 763-8833 [email or web address] °  °[facility] [address], [place], [place] (336)723-1848, Ext. 123 Second and Fourth Thursday of each month, opens at 6:30 AM; Clinic ends at 9 AM.  Patients are seen on a first-come first-served basis, and a limited number are seen during each clinic.  ° °[facility] ° [address], [place],  (336) 723-7904   Eligibility   Requirements °You must have lived in [place], [place], or [place] counties for at least the last three months. °  You cannot be eligible for state or federal sponsored healthcare insurance, including Veterans Administration, Medicaid, or Medicare. °  You generally cannot be eligible for healthcare insurance through your employer.  °  How to apply: °Eligibility screenings are held every Tuesday and Wednesday afternoon from 1:00 pm until 4:00 pm. You do not need an appointment for the interview!  °[facility] [address], [place], [place] 336-631-2330   °[facility]  336-342-8273   °[facility]  336-703-3100   °[facility]  336-570-6415   ° °Behavioral Health Resources in the Community: °Intensive Outpatient Programs °Organization         Address  Phone  Notes  °[facility] [address], [place], [place] 336-878-6098   °[facility] [address], [place], [place] 336-832-9800   °[facility] [address], [place], [place] ° 336-882-2125   °[facility] [address],  °[place], [place] 1-800-853-5163 or 336-641-4981   °Substance Abuse Resources °Organization         Address  Phone  Notes  °[facility]  336-882-2125   °[facility]  336-784-9470   °[facility]  336-285-9073   °[facility]  336-845-3988   °Residential &  Outpatient Substance Abuse Program  1-800-659-3381   °Psychological Services °Organization         Address  Phone  Notes  °[facility]  336- 832-9600   °[facility]  336- 378-7881   °[facility] [address], [place] 1-800-853-5163 or 336-641-4981   ° °Mobile Crisis Teams °Organization         Address  Phone  Notes  °[facility], Mobile Crisis Care Unit  1-877-626-1772   °Assertive °[facility] ° [address] [place], [place] 336-834-9664   °Sharon DeEsch [address] °[place] 336-554-5454   ° °Self-Help/Support Groups °Organization         Address  Phone             Notes  °[facility] - variety of support groups  336- 373-1402 Call for more information  °Narcotics Anonymous (NA), [facility] [address], °[place]  2 meetings at this location  ° °Residential Treatment Programs °Organization         Address  Phone  Notes  °[facility] [address],    °[place] [facility]  1-866-801-8205   °[facility] ° [address], [place], [place] 704-293-8524   °[facility] [address], [place] 336-845-3988 Admissions: 8am-3pm M-F  °[facility] [address]    °[place], [place] 336-841-1104   °[facility] [address], [place], [place] 336-379-7146   °[facility] [address]  °[place], [place] 336-285-9073   °[facility] - Intensive Outpatient [address], [place], [place] 336-852-3033   °[facility] [address]  °[place], [place] 1-877-615-2722 or 336-784-9470   °[facility] [address] [place], [place] 336-227-7417 Accepts Medicaid  °[facility] [address]  °  1-800-659-3381 Substance Abuse/Addiction Treatment  ° °[place] Behavioral Health Resources °Organization            Address  Phone  Notes  °[facility]  (888) 581-9988   °Julie Brannon, PhD [address] [place], [place]   (336) 349-5553 or (336) 951-0000   °[facility]   [address] °[place], [place] (336) 349-4454   °[facility] [address], [place], [place] (336) 342-8316 Insurance/Medicaid/sponsorship through [facility]  °[facility] [address]                                    [place], [place] (336) 342-8316 Therapy/tele-psych/case  °[facility] [address]  ° [place], [place] (336) 349-2233    °Dr. Arfeen  (336) 349-4544   °[facility] [place]  [employer] [place] [facility] 1) [address], [place] °2) [address], [place] °3)  [address], [place] (336) 349-3220 °(336) 342-7768 ° °(336) 342-8140   °[place] [facility] (336) 342-1394 or (336) 342-3537 (After Hours)    ° ° °

## 2015-03-16 ENCOUNTER — Ambulatory Visit: Payer: BLUE CROSS/BLUE SHIELD | Admitting: Family

## 2015-04-03 ENCOUNTER — Emergency Department (HOSPITAL_COMMUNITY)
Admission: EM | Admit: 2015-04-03 | Discharge: 2015-04-04 | Disposition: A | Discharge status: Home / Self Care | Payer: BLUE CROSS/BLUE SHIELD | Attending: Emergency Medicine | Admitting: Emergency Medicine

## 2015-04-03 DIAGNOSIS — K122 Cellulitis and abscess of mouth: Secondary | ICD-10-CM | POA: Diagnosis not present

## 2015-04-03 DIAGNOSIS — Z8679 Personal history of other diseases of the circulatory system: Secondary | ICD-10-CM | POA: Insufficient documentation

## 2015-04-03 DIAGNOSIS — Z87891 Personal history of nicotine dependence: Secondary | ICD-10-CM | POA: Insufficient documentation

## 2015-04-03 DIAGNOSIS — Z8619 Personal history of other infectious and parasitic diseases: Secondary | ICD-10-CM | POA: Diagnosis not present

## 2015-04-03 DIAGNOSIS — K029 Dental caries, unspecified: Secondary | ICD-10-CM | POA: Insufficient documentation

## 2015-04-03 DIAGNOSIS — K0889 Other specified disorders of teeth and supporting structures: Secondary | ICD-10-CM | POA: Diagnosis present

## 2015-04-04 ENCOUNTER — Encounter (HOSPITAL_COMMUNITY): Payer: Self-pay | Admitting: Family Medicine

## 2015-04-04 MED ORDER — OXYCODONE-ACETAMINOPHEN 5-325 MG PO TABS
1.0000 | ORAL_TABLET | ORAL | Status: AC
  Administered 2015-04-04: 1 via ORAL
  Filled 2015-04-04: qty 1

## 2015-04-04 MED ORDER — PENICILLIN V POTASSIUM 500 MG PO TABS
500.0000 mg | ORAL_TABLET | Freq: Once | ORAL | Status: AC
  Administered 2015-04-04: 500 mg via ORAL
  Filled 2015-04-04: qty 1

## 2015-04-04 MED ORDER — TRAMADOL HCL 50 MG PO TABS: 50.0000 mg | ORAL_TABLET | Freq: Four times a day (QID) | ORAL | Status: DC | PRN

## 2015-04-04 MED ORDER — LIDOCAINE HCL (PF) 1 % IJ SOLN
2.0000 mL | Freq: Once | INTRAMUSCULAR | Status: AC
  Administered 2015-04-04: 5 mL
  Filled 2015-04-04: qty 5

## 2015-04-04 MED ORDER — PENICILLIN V POTASSIUM 500 MG PO TABS: 500.0000 mg | ORAL_TABLET | Freq: Four times a day (QID) | ORAL | Status: DC

## 2015-04-04 NOTE — Discharge Instructions (Signed)
Abscess An abscess (boil or furuncle) is an infected area on or under the skin. This area is filled with yellowish-white fluid (pus) and other material (debris). HOME CARE   Only take medicines as told by your doctor.  If you were given antibiotic medicine, take it as directed. Finish the medicine even if you start to feel better.  If gauze is used, follow your doctor's directions for changing the gauze.  To avoid spreading the infection:  Keep your abscess covered with a bandage.  Wash your hands well.  Do not share personal care items, towels, or whirlpools with others.  Avoid skin contact with others.  Keep your skin and clothes clean around the abscess.  Keep all doctor visits as told. GET HELP RIGHT AWAY IF:   You have more pain, puffiness (swelling), or redness in the wound site.  You have more fluid or blood coming from the wound site.  You have muscle aches, chills, or you feel sick.  You have a fever. MAKE SURE YOU:   Understand these instructions.  Will watch your condition.  Will get help right away if you are not doing well or get worse.   This information is not intended to replace advice given to you by your health care provider. Make sure you discuss any questions you have with your health care provider.   Document Released: 08/08/2007 Document Revised: 08/21/2011 Document Reviewed: 05/05/2011 Elsevier Interactive Patient Education 2016 [employer].  Dental Care and Dentist Visits Dental care supports good overall health. Regular dental visits can also help you avoid dental pain, bleeding, infection, and other more serious health problems in the future. It is important to keep the mouth healthy because diseases in the teeth, gums, and other oral tissues can spread to other areas of the body. Some problems, such as diabetes, heart disease, and pre-term labor have been associated with poor oral health.  See your dentist every 6 months. If you experience  emergency problems such as a toothache or broken tooth, go to the dentist right away. If you see your dentist regularly, you may catch problems early. It is easier to be treated for problems in the early stages.  WHAT TO EXPECT AT A DENTIST VISIT  Your dentist will look for many common oral health problems and recommend proper treatment. At your regular dental visit, you can expect:  Gentle cleaning of the teeth and gums. This includes scraping and polishing. This helps to remove the sticky substance around the teeth and gums (plaque). Plaque forms in the mouth shortly after eating. Over time, plaque hardens on the teeth as tartar. If tartar is not removed regularly, it can cause problems. Cleaning also helps remove stains.  Periodic X-rays. These pictures of the teeth and supporting bone will help your dentist assess the health of your teeth.  Periodic fluoride treatments. Fluoride is a natural mineral shown to help strengthen teeth. Fluoride treatmentinvolves applying a fluoride gel or varnish to the teeth. It is most commonly done in children.  Examination of the mouth, tongue, jaws, teeth, and gums to look for any oral health problems, such as:  Cavities (dental caries). This is decay on the tooth caused by plaque, sugar, and acid in the mouth. It is best to catch a cavity when it is small.  Inflammation of the gums caused by plaque buildup (gingivitis).  Problems with the mouth or malformed or misaligned teeth.  Oral cancer or other diseases of the soft tissues or jaws. KEEP YOUR  TEETH AND GUMS HEALTHY For healthy teeth and gums, follow these general guidelines as well as your dentist's specific advice:  Have your teeth professionally cleaned at the dentist every 6 months.  Brush twice daily with a fluoride toothpaste.  Floss your teeth daily.  Ask your dentist if you need fluoride supplements, treatments, or fluoride toothpaste.  Eat a healthy diet. Reduce foods and drinks with  added sugar.  Avoid smoking. TREATMENT FOR ORAL HEALTH PROBLEMS If you have oral health problems, treatment varies depending on the conditions present in your teeth and gums.  Your caregiver will most likely recommend good oral hygiene at each visit.  For cavities, gingivitis, or other oral health disease, your caregiver will perform a procedure to treat the problem. This is typically done at a separate appointment. Sometimes your caregiver will refer you to another dental specialist for specific tooth problems or for surgery. SEEK IMMEDIATE DENTAL CARE IF:  You have pain, bleeding, or soreness in the gum, tooth, jaw, or mouth area.  A permanent tooth becomes loose or separated from the gum socket.  You experience a blow or injury to the mouth or jaw area.   This information is not intended to replace advice given to you by your health care provider. Make sure you discuss any questions you have with your health care provider.   Document Released: 11/01/2010 Document Revised: 05/14/2011 Document Reviewed: 11/01/2010 Elsevier Interactive Patient Education 2016 Elsevier Inc. Call DR Leanord Asal' office first thing in the morning tell them you have been referred through the ED they will make every effort to see you today

## 2015-04-04 NOTE — ED Provider Notes (Addendum)
CSN: [id number]     Arrival date & time 04/03/15  2353 History   First MD Initiated Contact with Patient 04/04/15 0012     Chief Complaint  Patient presents with  . Dental Pain     (Consider location/radiation/quality/duration/timing/severity/associated sxs/prior Treatment) HPI Comments: Dental pain and swelling  The history is provided by the patient.    Past Medical History  Diagnosis Date  . Blood in stool   . Chicken pox   . GERD (gastroesophageal reflux disease)   . Migraine    Past Surgical History  Procedure Laterality Date  . Hernia repair     Family History  Problem Relation Age of Onset  . Diabetes Mother   . Heart disease Mother   . Hypertension Mother   . Diabetes Father   . Diabetes Sister   . Hypertension Sister   . Breast cancer Maternal Grandmother   . Breast cancer Paternal Grandmother    Social History  Substance Use Topics  . Smoking status: Former Smoker -- 0.01 packs/day for 0 years  . Smokeless tobacco: Never Used  . Alcohol Use: 1.2 oz/week    1 Cans of beer, 1 Shots of liquor, 0 Standard drinks or equivalent per week     Comment: socially    Review of Systems  HENT: Positive for dental problem.   Respiratory: Negative for shortness of breath.   Neurological: Negative for headaches.  All other systems reviewed and are negative.     Allergies  Review of patient's allergies indicates no known allergies.  Home Medications   Prior to Admission medications   Medication Sig Start Date End Date Taking? Authorizing Provider  lidocaine (XYLOCAINE) 2 % solution Use as directed 15 mLs in the mouth or throat as needed for mouth pain. 03/11/15  Yes Veryl Speak, FNP  naproxen sodium (ANAPROX) 220 MG tablet Take 440 mg by mouth 2 (two) times daily as needed (for pain.).   Yes Historical Provider, MD  amoxicillin (AMOXIL) 500 MG capsule Take 1 capsule (500 mg total) by mouth 2 (two) times daily. Patient not taking: Reported on 04/04/2015  10/27/14   Veryl Speak, FNP  amoxicillin-clavulanate (AUGMENTIN) 875-125 MG per tablet Take 1 tablet by mouth every 12 (twelve) hours. Patient not taking: Reported on 04/04/2015 10/28/14   Danelle Berry, PA-C  penicillin v potassium (VEETID) 500 MG tablet Take 1 tablet (500 mg total) by mouth 4 (four) times daily. 04/04/15   Earley Favor, NP  predniSONE (DELTASONE) 10 MG tablet Take 6 tablets x 1 day, 5 tablets x 1 day, 4 tablets x 1 day, 3 tablets x 1 day, 2 tablets x 1 day, 1 tablet x 1 day Patient not taking: Reported on 04/04/2015 10/27/14   Veryl Speak, FNP  traMADol (ULTRAM) 50 MG tablet Take 1 tablet (50 mg total) by mouth every 6 (six) hours as needed for moderate pain. 04/04/15   Earley Favor, NP   BP 140/96 mmHg  Pulse 82  Temp(Src) 98.1 F (36.7 C) (Oral)  Resp 20  Ht  (1.727 m)  Wt 61.236 kg  BMI 20.53 kg/m2  SpO2 97% Physical Exam  Constitutional: He appears well-developed and well-nourished.  HENT:  Head: Normocephalic.  Mouth/Throat:    Eyes: Pupils are equal, round, and reactive to light.  Neck: Normal range of motion.  Cardiovascular: Normal rate.   Musculoskeletal: Normal range of motion.  Lymphadenopathy:    He has no cervical adenopathy.  Neurological: He is alert.  Skin: Skin is warm.  Nursing note and vitals reviewed.   ED Course  .Marland KitchenIncision and Drainage Date/Time: 04/04/2015 12:39 AM Performed by: Earley Favor Authorized by: Earley Favor Consent: Verbal consent obtained. Written consent not obtained. Risks and benefits: risks, benefits and alternatives were discussed Consent given by: patient Patient understanding: patient states understanding of the procedure being performed Patient identity confirmed: verbally with patient Time out: Immediately prior to procedure a "time out" was called to verify the correct patient, procedure, equipment, support staff and site/side marked as required. Type: abscess Body area: mouth Location details:  palate Anesthesia: local infiltration Local anesthetic: lidocaine 1% without epinephrine Anesthetic total: 0.5 ml Patient sedated: no Scalpel size: 11 Needle gauge: 22 Incision type: single straight Incision depth: dermal Complexity: simple Drainage: purulent Drainage amount: copious Wound treatment: wound left open Patient tolerance: Patient tolerated the procedure well with no immediate complications   (including critical care time) Labs Review Labs Reviewed - No data to display  Imaging Review No results found. I have personally reviewed and evaluated these images and lab results as part of my medical decision-making.   EKG Interpretation None      MDM   Final diagnoses:  Oral abscess         Earley Favor, NP 04/04/15 0045  April Palumbo, MD 04/04/15 0130  Earley Favor, NP 05/06/15 0344  April Palumbo, MD 05/11/15 0454

## 2015-04-04 NOTE — ED Notes (Signed)
Pt is complaining of left upper dental pain that started a week ago. Took TRAMADOL 2 tablets about an hour ago.

## 2016-12-19 ENCOUNTER — Other Ambulatory Visit (HOSPITAL_COMMUNITY)
Admission: RE | Admit: 2016-12-19 | Discharge: 2016-12-19 | Disposition: A | Discharge status: Home / Self Care | Payer: 59 | Source: Ambulatory Visit | Attending: Nurse Practitioner | Admitting: Nurse Practitioner

## 2016-12-19 ENCOUNTER — Ambulatory Visit (INDEPENDENT_AMBULATORY_CARE_PROVIDER_SITE_OTHER): Payer: 59 | Admitting: Nurse Practitioner

## 2016-12-19 ENCOUNTER — Encounter: Payer: Self-pay | Admitting: Nurse Practitioner

## 2016-12-19 ENCOUNTER — Other Ambulatory Visit: Payer: 59

## 2016-12-19 VITALS — BP 120/78 | HR 68 | Temp 98.5°F | Resp 16 | Ht 68.0 in | Wt 145.8 lb

## 2016-12-19 DIAGNOSIS — L739 Follicular disorder, unspecified: Secondary | ICD-10-CM

## 2016-12-19 DIAGNOSIS — Z7251 High risk heterosexual behavior: Secondary | ICD-10-CM

## 2016-12-19 NOTE — Patient Instructions (Addendum)
Please head downstairs for labwork.  Apply warm compresses to the bumps on your groin several times a day. Dispose of your current razor. Let me know if the bumps worsen again.   I'd like to see you back for a routine physical at your convenience.  Thanks for letting me take care of you today :)  Folliculitis Folliculitis is inflammation of the hair follicles. Folliculitis most commonly occurs on the scalp, thighs, legs, back, and buttocks. However, it can occur anywhere on the body. What are the causes? This condition may be caused by:  A bacterial infection (common).  A fungal infection.  A viral infection.  Coming into contact with certain chemicals, especially oils and tars.  Shaving or waxing.  Applying greasy ointments or creams to your skin often.  Long-lasting folliculitis and folliculitis that keeps coming back can be caused by bacteria that live in the nostrils. What increases the risk? This condition is more likely to develop in people with:  A weakened immune system.  Diabetes.  Obesity.  What are the signs or symptoms? Symptoms of this condition include:  Redness.  Soreness.  Swelling.  Itching.  Small white or yellow, pus-filled, itchy spots (pustules) that appear over a reddened area. If there is an infection that goes deep into the follicle, these may develop into a boil (furuncle).  A group of closely packed boils (carbuncle). These tend to form in hairy, sweaty areas of the body.  How is this diagnosed? This condition is diagnosed with a skin exam. To find what is causing the condition, your health care provider may take a sample of one of the pustules or boils for testing. How is this treated? This condition may be treated by:  Applying warm compresses to the affected areas.  Taking an antibiotic medicine or applying an antibiotic medicine to the skin.  Applying or bathing with an antiseptic solution.  Taking an over-the-counter medicine  to help with itching.  Having a procedure to drain any pustules or boils. This may be done if a pustule or boil contains a lot of pus or fluid.  Laser hair removal. This may be done to treat long-lasting folliculitis.  Follow these instructions at home:  If directed, apply heat to the affected area as often as told by your health care provider. Use the heat source that your health care provider recommends, such as a moist heat pack or a heating pad. ? Place a towel between your skin and the heat source. ? Leave the heat on for 20-30 minutes. ? Remove the heat if your skin turns bright red. This is especially important if you are unable to feel pain, heat, or cold. You may have a greater risk of getting burned.  If you were prescribed an antibiotic medicine, use it as told by your health care provider. Do not stop using the antibiotic even if you start to feel better.  Take over-the-counter and prescription medicines only as told by your health care provider.  Do not shave irritated skin.  Keep all follow-up visits as told by your health care provider. This is important. Get help right away if:  You have more redness, swelling, or pain in the affected area.  Red streaks are spreading from the affected area.  You have a fever. This information is not intended to replace advice given to you by your health care provider. Make sure you discuss any questions you have with your health care provider. Document Released: 04/30/2001 Document Revised: 09/09/2015  Document Reviewed: 12/10/2014 Elsevier Interactive Patient Education  [employer].

## 2016-12-19 NOTE — Progress Notes (Signed)
Subjective:    Patient ID: Darius Espinoza, male    DOB: 04-14-1990, 26 y.o.   MRN: [id number]  HPI Darius Espinoza is a 26yo male who presents today with chief c/o rash. He describes as red itchy bumps on his groin. He first noticed the bumps about 1 month ago. The bumps began after he shaved his groin. The bumps are improving. He tried some cream from the drugstore for the rash but it did not get any better. He also has noticed some sharp intermittent rectal pain for the past several months. He denies fevers, fatigue, nausea, vomiting, abdominal pain, dysuria, hematuria, penile discharge, constipation, diarrhea, painful bowel movements, rectal bleeding. He is currently sexually active with one male partner, does not use protection. He would like std testing.   Review of Systems  See HPI  Past Medical History:  Diagnosis Date  . Blood in stool   . Chicken pox   . GERD (gastroesophageal reflux disease)   . Migraine      Social History   Social History  . Marital status: Single    Spouse name: N/A  . Number of children: 1  . Years of education: 6714   Occupational History  . Buyer, retailKitchen worker    Social History Main Topics  . Smoking status: Former Smoker    Packs/day: 0.01    Years: 0.00  . Smokeless tobacco: Never Used  . Alcohol use 1.2 oz/week    1 Cans of beer, 1 Shots of liquor per week     Comment: socially  . Drug use: No  . Sexual activity: Yes    Partners: Female    Birth control/ protection: None   Other Topics Concern  . Not on file   Social History Narrative   [place], [place]. Ra[place] in [place]. Currently resides in a townhome by himself. 1 fish. Fun: Spend time with daughter, play basketball, sleep   Denies religious beliefs that would effect health care.     Past Surgical History:  Procedure Laterality Date  . HERNIA REPAIR      Family History  Problem Relation Age of Onset  . Diabetes Mother   . Heart disease Mother    . Hypertension Mother   . Diabetes Father   . Diabetes Sister   . Hypertension Sister   . Breast cancer Maternal Grandmother   . Breast cancer Paternal Grandmother     No Known Allergies  No current outpatient prescriptions on file prior to visit.   No current facility-administered medications on file prior to visit.     BP 120/78 (BP Location: Left Arm, Patient Position: Sitting, Cuff Size: Normal)   Pulse 68   Temp 98.5 F (36.9 C) (Oral)   Resp 16   Ht 5\' 8"  (1.727 m)   Wt 145 lb 12.8 oz (66.1 kg)   SpO2 97%   BMI 22.17 kg/m       Objective:   Physical Exam  Constitutional: He is oriented to person, place, and time. He appears well-developed and well-nourished. No distress.  Cardiovascular: Normal rate, regular rhythm and normal heart sounds.   Pulmonary/Chest: Effort normal and breath sounds normal.  Genitourinary: Testes normal and penis normal. Rectal exam shows no external hemorrhoid. No discharge found.  Genitourinary Comments: Follicular pustules external groin. External rectum normal.  Neurological: He is alert and oriented to person, place, and time.  Skin: Skin is warm and dry.  Psychiatric: He has a normal  mood and affect. Judgment and thought content normal.      Assessment & Plan:   Follicultis-resolving. Discussed warm compresses and shaving hygiene. He'll let me know if no improvement.  Unprotected sexual intercourse - STD panel ordered. Will follow up with patient.

## 2016-12-20 LAB — URINE CYTOLOGY ANCILLARY ONLY
CHLAMYDIA, DNA PROBE: NEGATIVE
Neisseria Gonorrhea: NEGATIVE
Trichomonas: NEGATIVE

## 2016-12-20 LAB — HEPATITIS B CORE ANTIBODY, IGM: Hep B C IgM: NONREACTIVE

## 2016-12-20 LAB — RPR: RPR: NONREACTIVE

## 2016-12-20 LAB — HIV ANTIBODY (ROUTINE TESTING W REFLEX): HIV 1&2 Ab, 4th Generation: NONREACTIVE

## 2016-12-28 ENCOUNTER — Telehealth: Payer: Self-pay | Admitting: Nurse Practitioner

## 2016-12-28 MED ORDER — CEPHALEXIN 500 MG PO CAPS: 500.0000 mg | ORAL_CAPSULE | Freq: Four times a day (QID) | ORAL | 0 refills | Status: DC

## 2016-12-28 NOTE — Telephone Encounter (Signed)
Patient informed about antibiotic.  Patient states he would also like a cream for the rash?

## 2016-12-28 NOTE — Telephone Encounter (Signed)
LVM for pt to call back as soon as possible.   

## 2016-12-28 NOTE — Telephone Encounter (Signed)
Patient states that [facility] told him to call back if rash had not cleared up by today.  States would send a cream to his pharmacy.  Patient uses [facility] on [address].

## 2016-12-28 NOTE — Telephone Encounter (Signed)
I have sent a prescription for keflex antibiotic to his pharmacy. This is an oral antibiotic. He should take this 4 times a day for 7 days until complete. Let me know if no improvement after the antibiotic.

## 2016-12-31 NOTE — Telephone Encounter (Signed)
I provided the oral antibiotic because of the location. It might be hard to properly treat the rash with cream due to location. I'd like for him to try the oral antibiotic instead.

## 2016-12-31 NOTE — Telephone Encounter (Signed)
LVM for pt to call back as soon as possible.   

## 2017-01-01 MED ORDER — CEPHALEXIN 500 MG PO CAPS: 500.0000 mg | ORAL_CAPSULE | Freq: Four times a day (QID) | ORAL | 0 refills | Status: DC

## 2017-01-01 NOTE — Telephone Encounter (Signed)
Pt given response, he would like his antibiotic [facility] to the [facility] on file instead, please advise

## 2017-01-01 NOTE — Telephone Encounter (Signed)
Rx resent to [facility]

## 2017-01-29 ENCOUNTER — Encounter: Payer: Self-pay | Admitting: Internal Medicine

## 2017-01-29 ENCOUNTER — Ambulatory Visit (INDEPENDENT_AMBULATORY_CARE_PROVIDER_SITE_OTHER): Payer: 59 | Admitting: Internal Medicine

## 2017-01-29 VITALS — BP 114/76 | HR 68 | Temp 98.6°F | Resp 16 | Wt 147.0 lb

## 2017-01-29 DIAGNOSIS — H109 Unspecified conjunctivitis: Secondary | ICD-10-CM | POA: Diagnosis not present

## 2017-01-29 MED ORDER — POLYMYXIN B-TRIMETHOPRIM 10000-0.1 UNIT/ML-% OP SOLN: 1.0000 [drp] | OPHTHALMIC | 0 refills | Status: DC

## 2017-01-29 NOTE — Assessment & Plan Note (Signed)
Start topical antibiotic drop Warm compresses Call if no improvement

## 2017-01-29 NOTE — Progress Notes (Signed)
Subjective:    Patient ID: Darius Espinoza, male    DOB: April 01, 1990, 26 y.o.   MRN: [id number]  HPI He is here for an acute visit.   Eye swelling and irritation:  His symptoms started two days ago.  His right lower eye is swollen, red and has gotten worse since it started.  The eyelid is sore to touch.  The eye itches and is pink.  The eye was crusty in the morning.  He has a runny nose.  He denies fever/chills.  He denies cough, sore throat.    He denies eye pain or changes in vision.   Medications and allergies reviewed with patient and updated if appropriate.  Patient Active Problem List   Diagnosis Date Noted  . Routine general medical examination at a health care facility 06/18/2014  . Generalized headaches 03/11/2014    No current outpatient medications on file prior to visit.   No current facility-administered medications on file prior to visit.     Past Medical History:  Diagnosis Date  . Blood in stool   . Chicken pox   . GERD (gastroesophageal reflux disease)   . Migraine     Past Surgical History:  Procedure Laterality Date  . HERNIA REPAIR      Social History   Socioeconomic History  . Marital status: Single    Spouse name: None  . Number of children: 1  . Years of education: 6514  . Highest education level: None  Social Needs  . Financial resource strain: None  . Food insecurity - worry: None  . Food insecurity - inability: None  . Transportation needs - medical: None  . Transportation needs - non-medical: None  Occupational History  . Occupation: Buyer, retailKitchen worker  Tobacco Use  . Smoking status: Former Smoker    Packs/day: 0.01    Years: 0.00    Pack years: 0.00  . Smokeless tobacco: Never Used  Substance and Sexual Activity  . Alcohol use: Yes    Alcohol/week: 1.2 oz    Types: 1 Cans of beer, 1 Shots of liquor per week    Comment: socially  . Drug use: No  . Sexual activity: Yes    Partners: Female    Birth control/protection: None  Other  Topics Concern  . None  Social History Narrative   Born [place] raised in [place], [place]. [place] in [place]. Currently resides in a townhome by himself. 1 fish. Fun: Spend time with daughter, play basketball, sleep   Denies religious beliefs that would effect health care.     Family History  Problem Relation Age of Onset  . Diabetes Mother   . Heart disease Mother   . Hypertension Mother   . Diabetes Father   . Diabetes Sister   . Hypertension Sister   . Breast cancer Maternal Grandmother   . Breast cancer Paternal Grandmother     Review of Systems  Constitutional: Negative for chills and fever.  HENT: Positive for rhinorrhea. Negative for congestion and sore throat.   Eyes: Positive for pain, redness and itching. Negative for photophobia, discharge (eye crusty) and visual disturbance.  Respiratory: Negative for cough.        Objective:   Vitals:   01/29/17 1452  BP: 114/76  Pulse: 68  Resp: 16  Temp: 98.6 F (37 C)  SpO2: 98%   Filed Weights   01/29/17 1452  Weight: 147 lb (66.7 kg)   Body mass index is 22.35 kg/m.  Wt Readings from Last 3 Encounters:  01/29/17 147 lb (66.7 kg)  12/19/16 145 lb 12.8 oz (66.1 kg)  04/04/15 135 lb (61.2 kg)     Physical Exam  Constitutional: He appears well-developed and well-nourished. No distress.  HENT:  Head: Normocephalic and atraumatic.  Right Ear: External ear normal.  Left Ear: External ear normal.  Mouth/Throat: Oropharynx is clear and moist.  Eyes: EOM are normal. Right eye exhibits no discharge. Left eye exhibits no discharge. No scleral icterus.  Left conjunctiva normal. Right conjunctiva slightly erythematous.  Right lower eye lid slightly swollen and eythematous  Neck: Neck supple. No tracheal deviation present. No thyromegaly present.  Lymphadenopathy:    He has no cervical adenopathy.  Skin: Skin is warm and dry. No rash noted. He is not diaphoretic.          Assessment & Plan:    See Problem List for Assessment and Plan of chronic medical problems.

## 2017-01-29 NOTE — Patient Instructions (Signed)
Use the antibiotic in your eye as directed and apply warm compresses to your eye.     Bacterial Conjunctivitis Bacterial conjunctivitis is an infection of your conjunctiva. This is the clear membrane that covers the white part of your eye and the inner surface of your eyelid. This condition can make your eye:  Red or pink.  Itchy.  This condition is caused by bacteria. This condition spreads very easily from person to person (is contagious) and from one eye to the other eye. Follow these instructions at home: Medicines  Take or apply your antibiotic medicine as told by your doctor. Do not stop taking or applying the antibiotic even if you start to feel better.  Take or apply over-the-counter and prescription medicines only as told by your doctor.  Do not touch your eyelid with the eye drop bottle or the ointment tube. Managing discomfort  Wipe any fluid from your eye with a warm, wet washcloth or a cotton ball.  Place a cool, clean washcloth on your eye. Do this for 10-20 minutes, 3-4 times per day. General instructions  Do not wear contact lenses until the irritation is gone. Wear glasses until your doctor says it is okay to wear contacts.  Do not wear eye makeup until your symptoms are gone. Throw away any old makeup.  Change or wash your pillowcase every day.  Do not share towels or washcloths with anyone.  Wash your hands often with soap and water. Use paper towels to dry your hands.  Do not touch or rub your eyes.  Do not drive or use heavy machinery if your vision is blurry. Contact a doctor if:  You have a fever.  Your symptoms do not get better after 10 days. Get help right away if:  You have a fever and your symptoms suddenly get worse.  You have very bad pain when you move your eye.  Your face: ? Hurts. ? Is red. ? Is swollen.  You have sudden loss of vision. This information is not intended to replace advice given to you by your health care  provider. Make sure you discuss any questions you have with your health care provider. Document Released: 11/29/2007 Document Revised: 07/28/2015 Document Reviewed: 12/02/2014 Elsevier Interactive Patient Education  [employer]2018 Elsevier Inc.

## 2017-09-11 ENCOUNTER — Encounter: Payer: Self-pay | Admitting: Nurse Practitioner

## 2017-09-11 ENCOUNTER — Ambulatory Visit (INDEPENDENT_AMBULATORY_CARE_PROVIDER_SITE_OTHER): Payer: BLUE CROSS/BLUE SHIELD | Admitting: Nurse Practitioner

## 2017-09-11 VITALS — BP 138/88 | HR 73 | Temp 98.0°F | Resp 16 | Ht 68.0 in | Wt 147.0 lb

## 2017-09-11 DIAGNOSIS — K047 Periapical abscess without sinus: Secondary | ICD-10-CM

## 2017-09-11 MED ORDER — CLINDAMYCIN HCL 300 MG PO CAPS: 300.0000 mg | ORAL_CAPSULE | Freq: Three times a day (TID) | ORAL | 0 refills | Status: DC

## 2017-09-11 NOTE — Patient Instructions (Addendum)
Please stop amoxicillin. Please start clindamycin 300mg  3 times a day for 10 days. Please keep scheduled follow up with your dentist.   Dental Abscess A dental abscess is pus in or around a tooth. Follow these instructions at home:  Take medicines only as told by your dentist.  If you were prescribed antibiotic medicine, finish all of it even if you start to feel better.  Rinse your mouth (gargle) often with salt water.  Do not drive or use heavy machinery, like a lawn mower, while taking pain medicine.  Do not apply heat to the outside of your mouth.  Keep all follow-up visits as told by your dentist. This is important. Contact a doctor if:  Your pain is worse, and medicine does not help. Get help right away if:  You have a fever or chills.  Your symptoms suddenly get worse.  You have a very bad headache.  You have problems breathing or swallowing.  You have trouble opening your mouth.  You have puffiness (swelling) in your neck or around your eye. This information is not intended to replace advice given to you by your health care provider. Make sure you discuss any questions you have with your health care provider. Document Released: 07/06/2014 Document Revised: 07/28/2015 Document Reviewed: 02/16/2014 Elsevier Interactive Patient Education  [employer]2018 Elsevier Inc.

## 2017-09-11 NOTE — Progress Notes (Signed)
Name: Darius Espinoza   MRN: [id number]    DOB: 05-03-1990   Date:09/11/2017       Progress Note  Subjective  Chief Complaint  Chief Complaint  Patient presents with  . Facial Swelling    left sided facial swelling that has been going on since last week and got worse last night    HPI  Darius Espinoza is here today for evaluation of an acute complaint of facial swelling. He was first seen for dental pain and facial swelling last week by his dentist, started on a course of amoxicillin and scheduled for dental surgery in September, as he could not afford to have the surgery sooner. He started the amoxicillin this past weekend and is not having anymore dental pain but reports facial swelling seems to be getting worse, and is now in his left cheek and moving towards his left eye. He denies weakness, fever, chills, headaches, eye pain, vision changes, trouble swallowing or breathing, erythema. He is able to eat okay. He has been rubbing a cucumber on his face to try to help with swelling    Patient Active Problem List   Diagnosis Date Noted  . Bacterial conjunctivitis of right eye 01/29/2017  . Routine general medical examination at a health care facility 06/18/2014  . Generalized headaches 03/11/2014    Social History   Tobacco Use  . Smoking status: Former Smoker    Packs/day: 0.01    Years: 0.00    Pack years: 0.00  . Smokeless tobacco: Never Used  Substance Use Topics  . Alcohol use: Yes    Alcohol/week: 1.2 oz    Types: 1 Cans of beer, 1 Shots of liquor per week    Comment: socially     Current Outpatient Medications:  .  amoxicillin (AMOXIL) 500 MG capsule, Take 500 mg by mouth 3 (three) times daily., Disp: , Rfl:  .  trimethoprim-polymyxin b (POLYTRIM) ophthalmic solution, Place 1 drop into the right eye every 4 (four) hours., Disp: 10 mL, Rfl: 0  No Known Allergies  ROS  No other specific complaints in a complete review of systems (except as listed in HPI  above).  Objective  Vitals:   09/11/17 0926  BP: 138/88  Pulse: 73  Resp: 16  Temp: 98 F (36.7 C)  TempSrc: Oral  SpO2: 96%  Weight: 147 lb (66.7 kg)  Height: 5\' 8"  (1.727 m)    Body mass index is 22.35 kg/m.  Nursing Note and Vital Signs reviewed.  Physical Exam  Constitutional: Patient appears well-developed and well-nourished.  No distress.  HEENT: head atraumatic, normocephalic, pupils equal and reactive to light, EOM's intact,  no maxillary or frontal sinus tenderness, mild swelling to left cheek,neck supple without lymphadenopathy, oropharynx pink and moist without exudate.  No periorbital edema or erythema. Cardiovascular: Normal rate, regular rhythm, distal pulses intact. Pulmonary/Chest: Effort normal and breath sounds clear. No respiratory distress or retractions. Neurological: He is alert and oriented to person, place, and time. No cranial nerve deficit. Coordination, balance, strength, speech and gait are normal.  Skin: Skin is warm and dry. No rash noted. No erythema.  Psychiatric: Patient has a normal mood and affect. behavior is normal. Judgment and thought content normal.   Assessment & Plan RTC for CPE  Dental infection Due to worsening symptoms will stop amoxicillin and start clindamycin-dosing and side effects discussed F/u with dentist as planned Home management of dental infection, Red flags and when to present for emergency care or RTC including  fever >101.70F, new/worsening/un-resolving symptoms, reviewed with patient at time of visit. Follow up and care instructions discussed and provided in AVS. - clindamycin (CLEOCIN) 300 MG capsule; Take 1 capsule (300 mg total) by mouth 3 (three) times daily.  Dispense: 30 capsule; Refill: 0

## 2017-10-01 ENCOUNTER — Telehealth: Payer: Self-pay | Admitting: Nurse Practitioner

## 2017-10-01 MED ORDER — AMOXICILLIN 500 MG PO CAPS: 1000.0000 mg | ORAL_CAPSULE | Freq: Two times a day (BID) | ORAL | 0 refills | Status: DC

## 2017-10-01 NOTE — Telephone Encounter (Signed)
Ok for repeat antibx but would not do the same one; ok for amoxil course - done erx

## 2017-10-01 NOTE — Telephone Encounter (Addendum)
Called pt no answer LMOM (c) MD ok amoxil. Resent rx to correct [facility]. Pt requested rx to be sent to the [facility] in [place]. Cancel script that was sent to [address]...Raechel Chute/lmb

## 2017-10-01 NOTE — Telephone Encounter (Signed)
Darius Espinoza is out of the office pls advise on msg below../lmb 

## 2017-10-01 NOTE — Telephone Encounter (Signed)
Pt called b/c the last time he took amoxil he had facial swelling.  w/ clindamycin (CLEOCIN) 300 MG capsule [[id number]][[id number]] DISCONTINUED   he hasnt had any issues but he wont take amoxil, contact pt to advise

## 2017-10-01 NOTE — Telephone Encounter (Signed)
Copied from CRM [phone]#[id number]. Topic: General - Other >> Oct 01, 2017 10:26 AM Leafy Roobinson, Norma J wrote: Reason for CRM:pt saw ashley on 09/11/17 for dental infection. Pt would like another round of abx clindamycin sent to [facility] [place] phone number [phone]. Pt is working out of town. Pt has an appointment to see dentist around 11-16-17. Pt is on cancellation list. 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 If unable to reach patient on cell please call temporarily work number [phone] ask for kitchen

## 2017-10-01 NOTE — Addendum Note (Signed)
Addended by: Deatra JamesBRAND, Decarlos Empey M on: 10/01/2017 02:19 PM   Modules accepted: Orders

## 2018-02-05 ENCOUNTER — Encounter (HOSPITAL_COMMUNITY): Payer: Self-pay

## 2018-02-05 ENCOUNTER — Ambulatory Visit (HOSPITAL_COMMUNITY)
Admission: EM | Admit: 2018-02-05 | Discharge: 2018-02-05 | Disposition: A | Discharge status: Home / Self Care | Payer: BLUE CROSS/BLUE SHIELD | Attending: Emergency Medicine | Admitting: Emergency Medicine

## 2018-02-05 DIAGNOSIS — S86812A Strain of other muscle(s) and tendon(s) at lower leg level, left leg, initial encounter: Secondary | ICD-10-CM

## 2018-02-05 DIAGNOSIS — M7662 Achilles tendinitis, left leg: Secondary | ICD-10-CM | POA: Diagnosis not present

## 2018-02-05 MED ORDER — NAPROXEN 375 MG PO TABS: 375.0000 mg | ORAL_TABLET | Freq: Two times a day (BID) | ORAL | 0 refills | Status: DC

## 2018-02-05 NOTE — ED Provider Notes (Signed)
[facility]   [id number] 02/05/18 Arrival Time: 0815  CC: LLE pain  SUBJECTIVE: History from: patient. Darius Espinoza is a 27 y.o. male complains of LEFT lower extremity pain that began yesterday.  States he took a step while playing basketball and experienced pain.  Localizes the pain to the back of the left leg and calf.  Describes the pain as intermittent and burning in character.  Has NOT tried OTC medications.  Symptoms are made worse with walking, and flexing foot.  Denies similar symptoms in the past. Complains of associated swelling.  Denies fever, chills, erythema, ecchymosis, weakness, numbness and tingling.      ROS: As per HPI.  Past Medical History:  Diagnosis Date  . Blood in stool   . Chicken pox   . GERD (gastroesophageal reflux disease)   . Migraine    Past Surgical History:  Procedure Laterality Date  . HERNIA REPAIR     No Known Allergies No current facility-administered medications on file prior to encounter.    No current outpatient medications on file prior to encounter.   Social History   Socioeconomic History  . Marital status: Single    Spouse name: Not on file  . Number of children: 1  . Years of education: 4814  . Highest education level: Not on file  Occupational History  . Occupation: Buyer, retailKitchen worker  Social Needs  . Financial resource strain: Not on file  . Food insecurity:    Worry: Not on file    Inability: Not on file  . Transportation needs:    Medical: Not on file    Non-medical: Not on file  Tobacco Use  . Smoking status: Former Smoker    Packs/day: 0.01    Years: 0.00    Pack years: 0.00  . Smokeless tobacco: Never Used  Substance and Sexual Activity  . Alcohol use: Yes    Alcohol/week: 2.0 standard drinks    Types: 1 Cans of beer, 1 Shots of liquor per week    Comment: socially  . Drug use: No  . Sexual activity: Yes    Partners: Female    Birth control/protection: None  Lifestyle  . Physical activity:    Days  per week: Not on file    Minutes per session: Not on file  . Stress: Not on file  Relationships  . Social connections:    Talks on phone: Not on file    Gets together: Not on file    Attends religious service: Not on file    Active member of club or organization: Not on file    Attends meetings of clubs or organizations: Not on file    Relationship status: Not on file  . Intimate partner violence:    Fear of current or ex partner: Not on file    Emotionally abused: Not on file    Physically abused: Not on file    Forced sexual activity: Not on file  Other Topics Concern  . Not on file  Social History Narrative   [place] Currently resides in a townhome by himself. 1 fish. Fun: Spend time with daughter, play basketball, sleep   Denies religious beliefs that would effect health care.    Family History  Problem Relation Age of Onset  . Diabetes Mother   . Heart disease Mother   . Hypertension Mother   . Diabetes Father   . Diabetes Sister   .  Hypertension Sister   . Breast cancer Maternal Grandmother   . Breast cancer Paternal Grandmother     OBJECTIVE:  Vitals:   02/05/18 0834  BP: 134/82  Pulse: 78  Resp: 20  Temp: 98.1 F (36.7 C)  TempSrc: Oral  SpO2: 96%    General appearance: Alert; in no acute distress.  Head: NCAT Lungs: CTA bilaterally Heart: RRR.  Clear S1 and S2 without murmur, gallops, or rubs.  Radial pulses 2+ bilaterally. Musculoskeletal: LLE Inspection: Skin warm, dry, clear and intact without obvious erythema, effusion, or ecchymosis.  Palpation: Tender to palpation over the achilles tendon; diffuse tenderness about the gastrocnemius muscle ROM: FROM active and passive Strength: 4+/5 dorsiflexion, 4+/5 plantar flexion Negative Thompsons Skin: warm and dry Neurologic: Ambulates with minimal difficulty; Sensation intact about the lower extremities Psychological: alert and  cooperative; normal mood and affect  ASSESSMENT & PLAN:  1. Achilles tendinitis of left lower extremity   2. Strain of calf muscle, left, initial encounter     Meds ordered this encounter  Medications  . naproxen (NAPROSYN) 375 MG tablet    Sig: Take 1 tablet (375 mg total) by mouth 2 (two) times daily.    Dispense:  20 tablet    Refill:  0    Order Specific Question:   Supervising Provider    Answer:   Eustace Moore [[id number]]   Ankle brace placed.   Declines crutches at this time Continue conservative management of rest, ice, elevation and gentle stretches Take naproxen as needed for pain relief (may cause abdominal discomfort, ulcers, and GI bleeds avoid taking with other NSAIDs) Follow up with orthopedist if symptoms persists Return or go to the ER if you have any new or worsening symptoms (fever, chills, chest pain, abdominal pain, changes in bowel or bladder habits, pain radiating into lower legs, etc...)   Reviewed expectations re: course of current medical issues. Questions answered. Outlined signs and symptoms indicating need for more acute intervention. Patient verbalized understanding. After Visit Summary given.    Rennis Harding, PA-C 02/05/18 1626

## 2018-02-05 NOTE — ED Triage Notes (Signed)
Pt presents with leg pain in the back of left calf area that radiates down to left ankle.

## 2018-02-05 NOTE — Discharge Instructions (Signed)
Ankle brace placed.   Declines crutches at this time Continue conservative management of rest, ice, elevation and gentle stretches Take naproxen as needed for pain relief (may cause abdominal discomfort, ulcers, and GI bleeds avoid taking with other NSAIDs) Follow up with orthopedist if symptoms persists Return or go to the ER if you have any new or worsening symptoms (fever, chills, chest pain, abdominal pain, changes in bowel or bladder habits, pain radiating into lower legs, etc...)

## 2018-02-13 ENCOUNTER — Ambulatory Visit (INDEPENDENT_AMBULATORY_CARE_PROVIDER_SITE_OTHER)
Admission: RE | Admit: 2018-02-13 | Discharge: 2018-02-13 | Disposition: A | Discharge status: Home / Self Care | Payer: BLUE CROSS/BLUE SHIELD | Source: Ambulatory Visit | Attending: Nurse Practitioner | Admitting: Nurse Practitioner

## 2018-02-13 ENCOUNTER — Ambulatory Visit: Payer: BLUE CROSS/BLUE SHIELD | Admitting: Nurse Practitioner

## 2018-02-13 ENCOUNTER — Encounter: Payer: Self-pay | Admitting: Nurse Practitioner

## 2018-02-13 VITALS — BP 126/80 | HR 62 | Ht 68.0 in | Wt 153.0 lb

## 2018-02-13 DIAGNOSIS — S99922A Unspecified injury of left foot, initial encounter: Secondary | ICD-10-CM | POA: Diagnosis not present

## 2018-02-13 DIAGNOSIS — M79672 Pain in left foot: Secondary | ICD-10-CM

## 2018-02-13 DIAGNOSIS — M25572 Pain in left ankle and joints of left foot: Secondary | ICD-10-CM

## 2018-02-13 DIAGNOSIS — S99912A Unspecified injury of left ankle, initial encounter: Secondary | ICD-10-CM | POA: Diagnosis not present

## 2018-02-13 NOTE — Progress Notes (Signed)
Darius Espinoza is a 27 y.o. male with the following history as recorded in Epic[facility]:  Patient Active Problem List   Diagnosis Date Noted  . Bacterial conjunctivitis of right eye 01/29/2017  . Routine general medical examination at a health care facility 06/18/2014  . Generalized headaches 03/11/2014    Current Outpatient Medications  Medication Sig Dispense Refill  . naproxen (NAPROSYN) 375 MG tablet Take 1 tablet (375 mg total) by mouth 2 (two) times daily. 20 tablet 0   No current facility-administered medications for this visit.     Allergies: Patient has no known allergies.  Past Medical History:  Diagnosis Date  . Blood in stool   . Chicken pox   . GERD (gastroesophageal reflux disease)   . Migraine     Past Surgical History:  Procedure Laterality Date  . HERNIA REPAIR      Family History  Problem Relation Age of Onset  . Diabetes Mother   . Heart disease Mother   . Hypertension Mother   . Diabetes Father   . Diabetes Sister   . Hypertension Sister   . Breast cancer Maternal Grandmother   . Breast cancer Paternal Grandmother     Social History   Tobacco Use  . Smoking status: Former Smoker    Packs/day: 0.01    Years: 0.00    Pack years: 0.00  . Smokeless tobacco: Never Used  Substance Use Topics  . Alcohol use: Yes    Alcohol/week: 2.0 standard drinks    Types: 1 Cans of beer, 1 Shots of liquor per week    Comment: socially     Subjective:  Mr Flanagin is here today for evaluation of acute complaint of left ankle and foot pain and swelling. He injured left leg while playing basketball on 12/4, says he went to step on his left leg and just felt like his ankle gave out, "like someone kicked me in my heel," was seen at an UC that day and diagnosed with left achilles tendinitis and left calf muscle strain, an ankle brace was placed and he was discharged home with rx for naproxen and instructions to rest, ice and elevate the leg Since home, his pain has seemed to  improve but now concerned with swelling and bruising to left ankle and left heel that has developed since the [facility] visit. Pain is mostly when he bears weight onto his heel. Taking Naproxen prn which helps some Hurts to place weight onto the back of his foot No fevers, chills, falls, weakness,  tingling, erythema  ROS- See HPI  Objective:  Vitals:   02/13/18 0816  BP: 126/80  Pulse: 62  SpO2: 98%  Weight: 153 lb (69.4 kg)  Height: 5\' 8"  (1.727 m)    General: Well developed, well nourished, in no acute distress  Skin : Warm and dry.  Head: Normocephalic and atraumatic  Eyes: Sclera and conjunctiva clear; pupils round and reactive to light; extraocular movements intact  Oropharynx: Pink, supple. No suspicious lesions  Neck: Supple  Lungs: effort unlabored, no respiratory dietress CVS exam: regular rate and rhythm.  Musculoskeletal:     Left ankle: He exhibits swelling and ecchymosis. He exhibits normal range of motion and no deformity. No tenderness.     Left foot: Normal range of motion. Swelling present. No tenderness.  Extremities: No edema, cyanosis, clubbing  Vessels: Symmetric bilaterally  Neurologic: Alert and oriented; speech intact; face symmetrical; moves all extremities well; CNII-XII intact without focal deficit  Psychiatric: Normal mood  and affect.  Assessment:  1. Left foot pain   2. Acute left ankle pain     Plan:   xrays today Continue naproxen prn Home management, RICE, red flags and return precautions including when to seek immediate care discussed and printed on AVS F/U with further recommendations pending  results  No follow-ups on file.  Orders Placed This Encounter  Procedures  . DG Ankle Complete Left    Standing Status:   Future    Number of Occurrences:   1    Standing Expiration Date:   04/17/2019    Order Specific Question:   Reason for Exam (SYMPTOM  OR DIAGNOSIS REQUIRED)    Answer:   injury, swelling    Order Specific Question:   Preferred  imaging location?    Answer:   [address]    Order Specific Question:   Radiology Contrast Protocol - do NOT remove file path    Answer:   \\[email or web address]  . DG Foot Complete Left    Standing Status:   Future    Number of Occurrences:   1    Standing Expiration Date:   04/17/2019    Order Specific Question:   Reason for Exam (SYMPTOM  OR DIAGNOSIS REQUIRED)    Answer:   injury swelling pain    Order Specific Question:   Preferred imaging location?    Answer:   [address]    Order Specific Question:   Radiology Contrast Protocol - do NOT remove file path    Answer:   \\[email or web address]    Requested Prescriptions    No prescriptions requested or ordered in this encounter

## 2018-02-13 NOTE — Patient Instructions (Addendum)
Head downstairs for xrays today   RICE for Routine Care of Injuries Many injuries can be cared for using rest, ice, compression, and elevation (RICE therapy). Using RICE therapy can help to lessen pain and swelling. It can help your body to heal. Rest Reduce your normal activities and avoid using the injured part of your body. You can go back to your normal activities when you feel okay and your doctor says it is okay. Ice Do not put ice on your bare skin.  Put ice in a plastic bag.  Place a towel between your skin and the bag.  Leave the ice on for 20 minutes, 2-3 times a day.  Do this for as long as told by your doctor. Compression Compression means putting pressure on the injured area. This can be done with an elastic bandage. If an elastic bandage has been applied:  Remove and reapply the bandage every 3-4 hours or as told by your doctor.  Make sure the bandage is not wrapped too tight. Wrap the bandage more loosely if part of your body beyond the bandage is blue, swollen, cold, painful, or loses feeling (numb).  See your doctor if the bandage seems to make your problems worse.  Elevation Elevation means keeping the injured area raised. Raise the injured area above your heart or the center of your chest if you can. When should I get help? You should get help if:  You keep having pain and swelling.  Your symptoms get worse.  Get help right away if: You should get help right away if:  You have sudden bad pain at or below the area of your injury.  You have redness or more swelling around your injury.  You have tingling or numbness at or below the injury that does not go away when you take off the bandage.  This information is not intended to replace advice given to you by your health care provider. Make sure you discuss any questions you have with your health care provider. Document Released: 08/08/2007 Document Revised: 01/17/2016 Document Reviewed: 01/27/2014 Elsevier  Interactive Patient Education  2017 [employer]

## 2018-02-14 ENCOUNTER — Encounter: Payer: Self-pay | Admitting: Family Medicine

## 2018-02-14 ENCOUNTER — Ambulatory Visit: Payer: Self-pay

## 2018-02-14 ENCOUNTER — Ambulatory Visit: Payer: BLUE CROSS/BLUE SHIELD | Admitting: Family Medicine

## 2018-02-14 VITALS — BP 120/84 | HR 85 | Resp 16 | Wt 150.0 lb

## 2018-02-14 DIAGNOSIS — M7660 Achilles tendinitis, unspecified leg: Secondary | ICD-10-CM

## 2018-02-14 DIAGNOSIS — S86012A Strain of left Achilles tendon, initial encounter: Secondary | ICD-10-CM | POA: Diagnosis not present

## 2018-02-14 NOTE — Patient Instructions (Signed)
Nice to meet you  Please get two heel lifts to put in your boot  You will get a call from the surgeon's office  Please send me a MyChart email if you have any questions.  Happy Holidays!

## 2018-02-14 NOTE — Progress Notes (Signed)
Darius Espinoza - 27 y.o. male MRN [id number]  Date of birth: Mar 22, 1990  SUBJECTIVE:  Including CC & ROS.  No chief complaint on file.   Darius Espinoza is a 27 y.o. male that is presenting with left Achilles pain.  He was playing basketball and had a sudden movement and felt a sharp pain in the back of his heel.  Since that time he has had significant swelling and ecchymosis.  He has been walking with a limp.  The pain has improved.  He has had no history of similar symptoms.  He has have weakness with moving his foot.  Feels like his symptoms are staying the same.   Review of Systems  Constitutional: Negative for fever.  HENT: Negative for congestion.   Respiratory: Negative for cough.   Cardiovascular: Negative for chest pain.  Gastrointestinal: Negative for abdominal pain.  Musculoskeletal: Positive for gait problem.  Skin: Negative for color change.  Neurological: Negative for weakness.  Hematological: Negative for adenopathy.  Psychiatric/Behavioral: Negative for agitation.    HISTORY: Past Medical, Surgical, Social, and Family History Reviewed & Updated per EMR.   Pertinent Historical Findings include:  Past Medical History:  Diagnosis Date  . Blood in stool   . Chicken pox   . GERD (gastroesophageal reflux disease)   . Migraine     Past Surgical History:  Procedure Laterality Date  . HERNIA REPAIR      No Known Allergies  Family History  Problem Relation Age of Onset  . Diabetes Mother   . Heart disease Mother   . Hypertension Mother   . Diabetes Father   . Diabetes Sister   . Hypertension Sister   . Breast cancer Maternal Grandmother   . Breast cancer Paternal Grandmother      Social History   Socioeconomic History  . Marital status: Single    Spouse name: Not on file  . Number of children: 1  . Years of education: 1814  . Highest education level: Not on file  Occupational History  . Occupation: Buyer, retailKitchen worker  Social Needs  . Financial resource  strain: Not on file  . Food insecurity:    Worry: Not on file    Inability: Not on file  . Transportation needs:    Medical: Not on file    Non-medical: Not on file  Tobacco Use  . Smoking status: Former Smoker    Packs/day: 0.01    Years: 0.00    Pack years: 0.00  . Smokeless tobacco: Never Used  Substance and Sexual Activity  . Alcohol use: Yes    Alcohol/week: 2.0 standard drinks    Types: 1 Cans of beer, 1 Shots of liquor per week    Comment: socially  . Drug use: No  . Sexual activity: Yes    Partners: Female    Birth control/protection: None  Lifestyle  . Physical activity:    Days per week: Not on file    Minutes per session: Not on file  . Stress: Not on file  Relationships  . Social connections:    Talks on phone: Not on file    Gets together: Not on file    Attends religious service: Not on file    Active member of club or organization: Not on file    Attends meetings of clubs or organizations: Not on file    Relationship status: Not on file  . Intimate partner violence:    Fear of current or ex partner: Not on file  Emotionally abused: Not on file    Physically abused: Not on file    Forced sexual activity: Not on file  Other Topics Concern  . Not on file  Social History Narrative   Born and raised in [place], [place]. Raised in [place] and [place]. Currently resides in a townhome by himself. 1 fish. Fun: Spend time with daughter, play basketball, sleep   Denies religious beliefs that would effect health care.      PHYSICAL EXAM:  VS: BP 120/84   Pulse 85   Resp 16   Wt 150 lb (68 kg)   SpO2 98%   BMI 22.81 kg/m  Physical Exam Gen: NAD, alert, cooperative with exam, well-appearing ENT: normal lips, normal nasal mucosa,  Eye: normal EOM, normal conjunctiva and lids CV:  no edema, +2 pedal pulses   Resp: no accessory muscle use, non-labored,  Skin: no rashes, no areas of induration  Neuro: normal tone, normal sensation to touch Psych:   normal insight, alert and oriented MSK:  Left Achilles: Swelling and ecchymosis of the Achilles and of the posterior heel. Tenderness to palpation at the mid substance portion of the Achilles. Weakness with plantarflexion and dorsiflexion. Thompson test with no plantarflexion. Neurovascularly intact  Limited ultrasound: Left Achilles:  Normal-appearing Achilles at the insertion.  As scanning proximally the Achilles becomes thickened.  Roughly 6 m proximally there is a please to be a complete disruption of the Achilles tendon.  Summary: Findings are suggestive complete Achilles tendon rupture  Ultrasound and interpretation by Clare Gandy, MD      ASSESSMENT & PLAN:   Achilles rupture, left Appears on ultrasound that he has a complete Achilles tendon rupture.  He does not have much pain. -Placed in cam walker today and encouraged to obtain heel lifts. -We will refer to surgery.  Go for Ortho and Dr. Susa Simmonds

## 2018-02-17 DIAGNOSIS — S86012A Strain of left Achilles tendon, initial encounter: Secondary | ICD-10-CM | POA: Insufficient documentation

## 2018-02-17 NOTE — Assessment & Plan Note (Signed)
Appears on ultrasound that he has a complete Achilles tendon rupture.  He does not have much pain. -Placed in cam walker today and encouraged to obtain heel lifts. -We will refer to surgery.  Go for Ortho and Dr. Susa SimmondsAdair

## 2018-02-20 DIAGNOSIS — M7662 Achilles tendinitis, left leg: Secondary | ICD-10-CM | POA: Diagnosis not present

## 2018-03-11 DIAGNOSIS — S86022D Laceration of left Achilles tendon, subsequent encounter: Secondary | ICD-10-CM | POA: Diagnosis not present

## 2018-03-11 DIAGNOSIS — M7662 Achilles tendinitis, left leg: Secondary | ICD-10-CM | POA: Diagnosis not present

## 2018-03-20 DIAGNOSIS — M7662 Achilles tendinitis, left leg: Secondary | ICD-10-CM | POA: Diagnosis not present

## 2018-04-25 ENCOUNTER — Ambulatory Visit: Payer: BLUE CROSS/BLUE SHIELD | Admitting: Nurse Practitioner

## 2018-05-02 ENCOUNTER — Other Ambulatory Visit: Payer: BLUE CROSS/BLUE SHIELD

## 2018-05-02 ENCOUNTER — Ambulatory Visit: Payer: BLUE CROSS/BLUE SHIELD | Admitting: Nurse Practitioner

## 2018-05-02 ENCOUNTER — Encounter: Payer: Self-pay | Admitting: Nurse Practitioner

## 2018-05-02 VITALS — BP 110/88 | HR 94 | Temp 99.3°F | Ht 68.0 in | Wt 155.0 lb

## 2018-05-02 DIAGNOSIS — Z113 Encounter for screening for infections with a predominantly sexual mode of transmission: Secondary | ICD-10-CM

## 2018-05-02 NOTE — Patient Instructions (Signed)
Head downstairs for labs   Safe Sex Practicing safe sex means taking steps before and during sex to reduce your risk of:  Getting an STD (sexually transmitted disease).  Giving your partner an STD.  Unwanted pregnancy. How can I practice safe sex? To practice safe sex:  Limit your sexual partners to only one partner who is having sex with only you.  Avoid using alcohol and recreational drugs before having sex. These substances can affect your judgment.  Before having sex with a new partner: ? Talk to your partner about past partners, past STDs, and drug use. ? You and your partner should be screened for STDs and discuss the results with each other.  Check your body regularly for sores, blisters, rashes, or unusual discharge. If you notice any of these problems, visit your health care provider.  If you have symptoms of an infection or you are being treated for an STD, avoid sexual contact.  While having sex, use a condom. Make sure to: ? Use a condom every time you have vaginal, oral, or anal sex. Both females and males should wear condoms during oral sex. ? Keep condoms in place from the beginning to the end of sexual activity. ? Use a latex condom, if possible. Latex condoms offer the best protection. ? Use only water-based lubricants or oils to lubricate a condom. Using petroleum-based lubricants or oils will weaken the condom and increase the chance that it will break.  See your health care provider for regular screenings, exams, and tests for STDs.  Talk with your health care provider about the form of birth control (contraception) that is best for you.  Get vaccinated against hepatitis B and human papillomavirus (HPV).  If you are at risk of being infected with HIV (human immunodeficiency virus), talk with your health care provider about taking a prescription medicine to prevent HIV infection. You are considered at risk for HIV if: ? You are a man who has sex with other  men. ? You are a heterosexual man or woman who is sexually active with more than one partner. ? You take drugs by injection. ? You are sexually active with a partner who has HIV. This information is not intended to replace advice given to you by your health care provider. Make sure you discuss any questions you have with your health care provider. Document Released: 03/29/2004 Document Revised: 07/06/2015 Document Reviewed: 01/09/2015 Elsevier Interactive Patient Education  2019 [employer].

## 2018-05-02 NOTE — Progress Notes (Signed)
Darius Espinoza is a 28 y.o. male with the following history as recorded in [facility]:  Patient Active Problem List   Diagnosis Date Noted  . Achilles rupture, left 02/17/2018  . Bacterial conjunctivitis of right eye 01/29/2017  . Routine general medical examination at a health care facility 06/18/2014  . Generalized headaches 03/11/2014    Current Outpatient Medications  Medication Sig Dispense Refill  . naproxen (NAPROSYN) 375 MG tablet Take 1 tablet (375 mg total) by mouth 2 (two) times daily. (Patient not taking: Reported on 05/02/2018) 20 tablet 0   No current facility-administered medications for this visit.     Allergies: Patient has no known allergies.  Past Medical History:  Diagnosis Date  . Blood in stool   . Chicken pox   . GERD (gastroesophageal reflux disease)   . Migraine     Past Surgical History:  Procedure Laterality Date  . HERNIA REPAIR      Family History  Problem Relation Age of Onset  . Diabetes Mother   . Heart disease Mother   . Hypertension Mother   . Diabetes Father   . Diabetes Sister   . Hypertension Sister   . Breast cancer Maternal Grandmother   . Breast cancer Paternal Grandmother     Social History   Tobacco Use  . Smoking status: Former Smoker    Packs/day: 0.01    Years: 0.00    Pack years: 0.00  . Smokeless tobacco: Never Used  Substance Use Topics  . Alcohol use: Yes    Alcohol/week: 2.0 standard drinks    Types: 1 Cans of beer, 1 Shots of liquor per week    Comment: socially     Subjective:  Darius Espinoza is here today requesting STD screening, sexually active with 1 male partner for past year, says he does not have any symptoms he'd just like to get checked. He denies rash, abdominal pain, nausea, vomiting, dysuria, hematuria, urinary frequency, penile pain, penile discharge.  ROS - See HPI  Objective:  Vitals:   05/02/18 1320  BP: 110/88  Pulse: 94  Temp: 99.3 F (37.4 C)  TempSrc: Oral  SpO2: 95%  Weight: 155 lb  (70.3 kg)  Height: 5\' 8"  (1.727 m)    General: Well developed, well nourished, in no acute distress  Skin : Warm and dry.  Head: Normocephalic and atraumatic  Eyes: Sclera and conjunctiva clear; pupils round and reactive to light; extraocular movements intact  Oropharynx: Pink, supple. No suspicious lesions  Neck: Supple Lungs: Respirations unlabored; clear to auscultation bilaterally without wheeze, rales, rhonchi  CVS exam: normal rate and regular rhythm, S1 and S2 normal.  Extremities: No edema, cyanosis, clubbing  Vessels: Symmetric bilaterally  Neurologic: Alert and oriented; speech intact; face symmetrical; moves all extremities well; CNII-XII intact without focal deficit  Psychiatric: Normal mood and affect.  Assessment:  1. Screen for STD (sexually transmitted disease)     Plan:   labs ordered F/U with further recommendations pending lab results Encouraged safe sex practices, additional information provided on AVS  No follow-ups on file.  Orders Placed This Encounter  Procedures  . GC/Chlamydia Probe Amp    Standing Status:   Future    Standing Expiration Date:   05/03/2019  . RPR    Standing Status:   Future    Number of Occurrences:   1    Standing Expiration Date:   05/03/2019  . HIV Antibody (routine testing w rflx)    Standing Status:  Future    Number of Occurrences:   1    Standing Expiration Date:   05/31/2018    Requested Prescriptions    No prescriptions requested or ordered in this encounter

## 2018-05-05 LAB — HIV ANTIBODY (ROUTINE TESTING W REFLEX): HIV 1&2 Ab, 4th Generation: NONREACTIVE

## 2018-05-05 LAB — RPR: RPR Ser Ql: NONREACTIVE

## 2018-05-06 LAB — GC/CHLAMYDIA PROBE AMP
Chlamydia trachomatis, NAA: NEGATIVE
Neisseria gonorrhoeae by PCR: NEGATIVE

## 2018-08-22 ENCOUNTER — Ambulatory Visit: Payer: BLUE CROSS/BLUE SHIELD | Admitting: Internal Medicine

## 2018-10-31 ENCOUNTER — Other Ambulatory Visit: Payer: Self-pay

## 2018-10-31 ENCOUNTER — Ambulatory Visit (HOSPITAL_COMMUNITY)
Admission: EM | Admit: 2018-10-31 | Discharge: 2018-10-31 | Disposition: A | Discharge status: Home / Self Care | Payer: Self-pay | Attending: Emergency Medicine | Admitting: Emergency Medicine

## 2018-10-31 ENCOUNTER — Encounter (HOSPITAL_COMMUNITY): Payer: Self-pay

## 2018-10-31 DIAGNOSIS — L0291 Cutaneous abscess, unspecified: Secondary | ICD-10-CM

## 2018-10-31 MED ORDER — SULFAMETHOXAZOLE-TRIMETHOPRIM 800-160 MG PO TABS: 1.0000 | ORAL_TABLET | Freq: Two times a day (BID) | ORAL | 0 refills | Status: AC

## 2018-10-31 NOTE — ED Triage Notes (Signed)
Pt presents with abscess on left underarm X 3 days.

## 2018-10-31 NOTE — ED Provider Notes (Signed)
[facility]    CSN: [id number] Arrival date & time: 10/31/18  1539      History   Chief Complaint Chief Complaint  Patient presents with  . Abscess    HPI Darius Espinoza is a 28 y.o. male.   Patient presents with an abscess in his left axilla x3 days.  He reports it is painful and red and swollen.  He has had similar abscesses in this area previously.  He denies fever, chills, drainage, or other symptoms.  The history is provided by the patient.    Past Medical History:  Diagnosis Date  . Blood in stool   . Chicken pox   . GERD (gastroesophageal reflux disease)   . Migraine     Patient Active Problem List   Diagnosis Date Noted  . Achilles rupture, left 02/17/2018  . Bacterial conjunctivitis of right eye 01/29/2017  . Routine general medical examination at a health care facility 06/18/2014  . Generalized headaches 03/11/2014    Past Surgical History:  Procedure Laterality Date  . HERNIA REPAIR         Home Medications    Prior to Admission medications   Medication Sig Start Date End Date Taking? Authorizing Provider  naproxen (NAPROSYN) 375 MG tablet Take 1 tablet (375 mg total) by mouth 2 (two) times daily. Patient not taking: Reported on 05/02/2018 02/05/18   Wurst, GrenadaBrittany, PA-C  sulfamethoxazole-trimethoprim (BACTRIM DS) 800-160 MG tablet Take 1 tablet by mouth 2 (two) times daily for 7 days. 10/31/18 11/07/18  Mickie Bailate, Anarie Kalish H, NP    Family History Family History  Problem Relation Age of Onset  . Diabetes Mother   . Heart disease Mother   . Hypertension Mother   . Diabetes Father   . Diabetes Sister   . Hypertension Sister   . Breast cancer Maternal Grandmother   . Breast cancer Paternal Grandmother     Social History Social History   Tobacco Use  . Smoking status: Former Smoker    Packs/day: 0.01    Years: 0.00    Pack years: 0.00  . Smokeless tobacco: Never Used  Substance Use Topics  . Alcohol use: Yes    Alcohol/week: 2.0  standard drinks    Types: 1 Cans of beer, 1 Shots of liquor per week    Comment: socially  . Drug use: No     Allergies   Patient has no known allergies.   Review of Systems Review of Systems  Constitutional: Negative for chills and fever.  HENT: Negative for ear pain and sore throat.   Eyes: Negative for pain and visual disturbance.  Respiratory: Negative for cough and shortness of breath.   Cardiovascular: Negative for chest pain and palpitations.  Gastrointestinal: Negative for abdominal pain and vomiting.  Genitourinary: Negative for dysuria and hematuria.  Musculoskeletal: Negative for arthralgias and back pain.  Skin: Positive for wound. Negative for color change and rash.  Neurological: Negative for seizures and syncope.  All other systems reviewed and are negative.    Physical Exam Triage Vital Signs ED Triage Vitals  Enc Vitals Group     BP 10/31/18 1604 124/83     Pulse Rate 10/31/18 1604 85     Resp 10/31/18 1604 17     Temp 10/31/18 1604 98.3 F (36.8 C)     Temp Source 10/31/18 1604 Tympanic     SpO2 10/31/18 1604 100 %     Weight --      Height --  Head Circumference --      Peak Flow --      Pain Score 10/31/18 1609 7     Pain Loc --      Pain Edu? --      Excl. in [place]? --    No data found.  Updated Vital Signs BP 124/83 (BP Location: Right Arm)   Pulse 85   Temp 98.3 F (36.8 C) (Tympanic)   Resp 17   SpO2 100%   Visual Acuity Right Eye Distance:   Left Eye Distance:   Bilateral Distance:    Right Eye Near:   Left Eye Near:    Bilateral Near:     Physical Exam Vitals signs and nursing note reviewed.  Constitutional:      Appearance: He is well-developed.  HENT:     Head: Normocephalic and atraumatic.  Eyes:     Conjunctiva/sclera: Conjunctivae normal.  Neck:     Musculoskeletal: Neck supple.  Cardiovascular:     Rate and Rhythm: Normal rate and regular rhythm.     Heart sounds: No murmur.  Pulmonary:     Effort:  Pulmonary effort is normal. No respiratory distress.     Breath sounds: Normal breath sounds.  Abdominal:     Palpations: Abdomen is soft.     Tenderness: There is no abdominal tenderness. There is no guarding or rebound.  Skin:    General: Skin is warm and dry.     Findings: Lesion present.     Comments: Left axilla: 3 cm firm non-fluctuant erythematous area of induration.  No lesion or drainage.   Neurological:     Mental Status: He is alert.     Sensory: No sensory deficit.     Motor: No weakness.      UC Treatments / Results  Labs (all labs ordered are listed, but only abnormal results are displayed) Labs Reviewed - No data to display  EKG   Radiology No results found.  Procedures Procedures (including critical care time)  Medications Ordered in UC Medications - No data to display  Initial Impression / Assessment and Plan / UC Course  I have reviewed the triage vital signs and the nursing notes.  Pertinent labs & imaging results that were available during my care of the patient were reviewed by me and considered in my medical decision making (see chart for details).   Abscess in left axilla.  Needle aspiration performed without drainage return.  Treating with Septra DS.  Instructed patient to take Tylenol or ibuprofen as needed for discomfort.  Instructed him to apply warm compresses several times a day.  Discussed that he should return here or follow-up with his PCP for a recheck and hopefully I&D in 2 to 3 days.  Discussed that he should go to the emergency department or return here if he has increased pain, erythema, edema or develops fever, chills, or other concerning symptoms.  Patient agrees with treatment plan.     Final Clinical Impressions(s) / UC Diagnoses   Final diagnoses:  Abscess     Discharge Instructions     Take the antibiotic as prescribed.  You can take Tylenol or ibuprofen as needed for discomfort.    Apply warm compresses several times a  day to the abscess.    Return here or go to the emergency department if you develop increased pain, redness, swelling; or if you develop other symptoms such as fever or chills.    Return here or follow-up with your  primary care provider for a recheck of this area in 2-3 days.        ED Prescriptions    Medication Sig Dispense Auth. Provider   sulfamethoxazole-trimethoprim (BACTRIM DS) 800-160 MG tablet Take 1 tablet by mouth 2 (two) times daily for 7 days. 14 tablet Mickie Bail, NP     Controlled Substance Prescriptions [place] Controlled Substance Registry consulted? Not Applicable   Mickie Bail, NP 10/31/18 [phone]

## 2018-10-31 NOTE — Discharge Instructions (Signed)
Take the antibiotic as prescribed.  You can take Tylenol or ibuprofen as needed for discomfort.    Apply warm compresses several times a day to the abscess.    Return here or go to the emergency department if you develop increased pain, redness, swelling; or if you develop other symptoms such as fever or chills.    Return here or follow-up with your primary care provider for a recheck of this area in 2-3 days.

## 2019-06-13 ENCOUNTER — Encounter (HOSPITAL_COMMUNITY): Payer: Self-pay

## 2019-06-13 ENCOUNTER — Other Ambulatory Visit: Payer: Self-pay

## 2019-06-13 ENCOUNTER — Ambulatory Visit (HOSPITAL_COMMUNITY)
Admission: EM | Admit: 2019-06-13 | Discharge: 2019-06-13 | Disposition: A | Discharge status: Home / Self Care | Payer: Self-pay | Attending: Physician Assistant | Admitting: Physician Assistant

## 2019-06-13 DIAGNOSIS — K047 Periapical abscess without sinus: Secondary | ICD-10-CM

## 2019-06-13 MED ORDER — IBUPROFEN 800 MG PO TABS: 800.0000 mg | ORAL_TABLET | Freq: Three times a day (TID) | ORAL | 0 refills | Status: DC

## 2019-06-13 MED ORDER — AMOXICILLIN-POT CLAVULANATE 875-125 MG PO TABS: 1.0000 | ORAL_TABLET | Freq: Two times a day (BID) | ORAL | 0 refills | Status: AC

## 2019-06-13 NOTE — ED Provider Notes (Signed)
[facility]    CSN: [id number] Arrival date & time: 06/13/19  1725      History   Chief Complaint Chief Complaint  Patient presents with  . Abscess    HPI Darius Espinoza is a 29 y.o. male.   Patient presents urgent care for evaluation of suspected dental abscess.  He reports this started yesterday.  He reports swelling but not significant pain.  He reports having a broken decaying tooth below the area of concern.  He denies any drainage.  Denies fever chills.  Denies difficulty swallowing.  He reports he has had issues with abscesses in his mouth previously.  Chart review does reveal oral abscesses in 2017 with one being drained on the roof of his mouth.  Patient has not had recent antibiotics.  Patient does not have a dentist.  He has not had dental follow-up due to lack of insurance and monetary concerns.  He is open to finding insurance and receiving dental care.     Past Medical History:  Diagnosis Date  . Blood in stool   . Chicken pox   . GERD (gastroesophageal reflux disease)   . Migraine     Patient Active Problem List   Diagnosis Date Noted  . Achilles rupture, left 02/17/2018  . Bacterial conjunctivitis of right eye 01/29/2017  . Routine general medical examination at a health care facility 06/18/2014  . Generalized headaches 03/11/2014    Past Surgical History:  Procedure Laterality Date  . HERNIA REPAIR         Home Medications    Prior to Admission medications   Medication Sig Start Date End Date Taking? Authorizing Provider  amoxicillin-clavulanate (AUGMENTIN) 875-125 MG tablet Take 1 tablet by mouth 2 (two) times daily for 10 days. 06/13/19 06/23/19  Rocio Wolak, Veryl Speak, PA-C  ibuprofen (ADVIL) 800 MG tablet Take 1 tablet (800 mg total) by mouth 3 (three) times daily. 06/13/19   Tijuana Scheidegger, Veryl Speak, PA-C  naproxen (NAPROSYN) 375 MG tablet Take 1 tablet (375 mg total) by mouth 2 (two) times daily. Patient not taking: Reported on 05/02/2018 02/05/18    Rennis Harding, PA-C    Family History Family History  Problem Relation Age of Onset  . Diabetes Mother   . Heart disease Mother   . Hypertension Mother   . Diabetes Father   . Diabetes Sister   . Hypertension Sister   . Breast cancer Maternal Grandmother   . Breast cancer Paternal Grandmother     Social History Social History   Tobacco Use  . Smoking status: Former Smoker    Packs/day: 0.01    Years: 0.00    Pack years: 0.00  . Smokeless tobacco: Never Used  Substance Use Topics  . Alcohol use: Yes    Alcohol/week: 2.0 standard drinks    Types: 1 Cans of beer, 1 Shots of liquor per week    Comment: socially  . Drug use: No     Allergies   Patient has no known allergies.   Review of Systems Review of Systems See HPI  Physical Exam Triage Vital Signs ED Triage Vitals [06/13/19 1737]  Enc Vitals Group     BP (!) 142/100     Pulse Rate 86     Resp 16     Temp (!) 97.5 F (36.4 C)     Temp Source Oral     SpO2 95 %     Weight      Height  Head Circumference      Peak Flow      Pain Score 8     Pain Loc      Pain Edu?      Excl. in [place]?    No data found.  Updated Vital Signs BP (!) 142/100   Pulse 86   Temp (!) 97.5 F (36.4 C) (Oral)   Resp 16   Ht 5\' 8"  (1.727 m)   Wt 150 lb (68 kg)   SpO2 95%   BMI 22.81 kg/m   Visual Acuity Right Eye Distance:   Left Eye Distance:   Bilateral Distance:    Right Eye Near:   Left Eye Near:    Bilateral Near:     Physical Exam Constitutional:      General: He is not in acute distress.    Appearance: Normal appearance. He is not ill-appearing.  HENT:     Nose: Nose normal.     Mouth/Throat:     Mouth: No angioedema.     Dentition: Abnormal dentition. Dental tenderness, gingival swelling, dental caries and dental abscesses present.      Comments: Very poor dentition there is also a decaying mandibular right-sided molar.   Oropharynx clear. Neurological:     Mental Status: He is alert.       UC Treatments / Results  Labs (all labs ordered are listed, but only abnormal results are displayed) Labs Reviewed - No data to display  EKG   Radiology No results found.  Procedures Procedures (including critical care time)  Medications Ordered in UC Medications - No data to display  Initial Impression / Assessment and Plan / UC Course  I have reviewed the triage vital signs and the nursing notes.  Pertinent labs & imaging results that were available during my care of the patient were reviewed by me and considered in my medical decision making (see chart for details).     #Dental abscess Patient 29 year old male presenting with concern of dental abscess.  Believe there is a early dental abscess present, discussed and would like to try antibiotics to possibly prevent need for drainage.  Did discuss that I did not feel comfortable attempting drainage in the urgent care this time and only he would likely need to report to the emergency room for drainage.  Patient agrees with this plan  -Start Augmentin -Ibuprofen for pain -Dental resources provided -Patient directed if not improving over the next 2 to 3 days or acutely worsening-either return urgent care or report to the emergency department for evaluation. Final Clinical Impressions(s) / UC Diagnoses   Final diagnoses:  Dental abscess     Discharge Instructions     Start the Augmentin tonight and continue this twice a day for 10 days Take the ibuprofen every 8 hours you may add to strength Tylenol every 8 hours.  Follow-up with the dental resources attached  If not improving over the next 2 to 3 days either return or report to the emergency department for possible abscess drainage.      ED Prescriptions    Medication Sig Dispense Auth. Provider   amoxicillin-clavulanate (AUGMENTIN) 875-125 MG tablet Take 1 tablet by mouth 2 (two) times daily for 10 days. 20 tablet Dawanna Grauberger, Marguerita Beards, PA-C   ibuprofen (ADVIL)  800 MG tablet Take 1 tablet (800 mg total) by mouth 3 (three) times daily. 21 tablet Isais Klipfel, Marguerita Beards, PA-C     PDMP not reviewed this encounter.   Shareece Bultman, Marguerita Beards, PA-C  06/13/19 1819  

## 2019-06-13 NOTE — Discharge Instructions (Signed)
Start the Augmentin tonight and continue this twice a day for 10 days Take the ibuprofen every 8 hours you may add to strength Tylenol every 8 hours.  Follow-up with the dental resources attached  If not improving over the next 2 to 3 days either return or report to the emergency department for possible abscess drainage.

## 2019-06-13 NOTE — ED Triage Notes (Signed)
Pt c/o abscess on upper left gum that started today.

## 2019-06-14 ENCOUNTER — Encounter (HOSPITAL_COMMUNITY): Payer: Self-pay | Admitting: Emergency Medicine

## 2019-06-14 ENCOUNTER — Emergency Department (HOSPITAL_COMMUNITY)
Admission: EM | Admit: 2019-06-14 | Discharge: 2019-06-14 | Disposition: A | Discharge status: Home / Self Care | Payer: Self-pay | Attending: Emergency Medicine | Admitting: Emergency Medicine

## 2019-06-14 ENCOUNTER — Other Ambulatory Visit: Payer: Self-pay

## 2019-06-14 DIAGNOSIS — K0889 Other specified disorders of teeth and supporting structures: Secondary | ICD-10-CM

## 2019-06-14 DIAGNOSIS — K047 Periapical abscess without sinus: Secondary | ICD-10-CM | POA: Insufficient documentation

## 2019-06-14 DIAGNOSIS — K029 Dental caries, unspecified: Secondary | ICD-10-CM | POA: Insufficient documentation

## 2019-06-14 MED ORDER — DIPHENHYDRAMINE HCL 25 MG PO CAPS
25.0000 mg | ORAL_CAPSULE | Freq: Once | ORAL | Status: AC
  Administered 2019-06-14: 25 mg via ORAL
  Filled 2019-06-14: qty 1

## 2019-06-14 MED ORDER — ACETAMINOPHEN 500 MG PO TABS
1000.0000 mg | ORAL_TABLET | Freq: Once | ORAL | Status: AC
  Administered 2019-06-14: 1000 mg via ORAL
  Filled 2019-06-14: qty 2

## 2019-06-14 NOTE — ED Triage Notes (Signed)
Pt presents with increase in swelling to dental abscess, pt states he did get meds filled from visit to [facility] today with no relief. Pt reports he is having difficulty eating and sleeping d/t pain and swelling

## 2019-06-14 NOTE — ED Provider Notes (Signed)
[place] EMERGENCY DEPARTMENT Provider Note   CSN: [id number] Arrival date & time: 06/14/19  0052     History Chief Complaint  Patient presents with  . Oral Swelling    lip swelling bc of tooth    Darius Espinoza is a 29 y.o. male.  The history is provided by the patient.  Dental Pain Location:  Upper Upper teeth location:  8/RU central incisor and 9/LU central incisor Quality:  Aching Severity:  Severe Onset quality:  Gradual Timing:  Constant Progression:  Unchanged Chronicity:  New Context: dental caries and dental fracture   Previous work-up:  Dental exam Relieved by:  Nothing Worsened by:  Nothing Ineffective treatments: augmentin this afternoon. Associated symptoms: no congestion, no fever, no neck swelling, no oral bleeding and no trismus   Associated symptoms comment:  Philtrum is mildly swollen Risk factors: no immunosuppression   Patient seen at urgent care and told if swelling not improved to come to the ER.       Past Medical History:  Diagnosis Date  . Blood in stool   . Chicken pox   . GERD (gastroesophageal reflux disease)   . Migraine     Patient Active Problem List   Diagnosis Date Noted  . Achilles rupture, left 02/17/2018  . Bacterial conjunctivitis of right eye 01/29/2017  . Routine general medical examination at a health care facility 06/18/2014  . Generalized headaches 03/11/2014    Past Surgical History:  Procedure Laterality Date  . HERNIA REPAIR         Family History  Problem Relation Age of Onset  . Diabetes Mother   . Heart disease Mother   . Hypertension Mother   . Diabetes Father   . Diabetes Sister   . Hypertension Sister   . Breast cancer Maternal Grandmother   . Breast cancer Paternal Grandmother     Social History   Tobacco Use  . Smoking status: Former Smoker    Packs/day: 0.01    Years: 0.00    Pack years: 0.00  . Smokeless tobacco: Never Used  Substance Use Topics  . Alcohol use:  Yes    Alcohol/week: 2.0 standard drinks    Types: 1 Cans of beer, 1 Shots of liquor per week    Comment: socially  . Drug use: No    Home Medications Prior to Admission medications   Medication Sig Start Date End Date Taking? Authorizing Provider  amoxicillin-clavulanate (AUGMENTIN) 875-125 MG tablet Take 1 tablet by mouth 2 (two) times daily for 10 days. 06/13/19 06/23/19  Darr, Marguerita Beards, PA-C  ibuprofen (ADVIL) 800 MG tablet Take 1 tablet (800 mg total) by mouth 3 (three) times daily. 06/13/19   Darr, Marguerita Beards, PA-C  naproxen (NAPROSYN) 375 MG tablet Take 1 tablet (375 mg total) by mouth 2 (two) times daily. Patient not taking: Reported on 05/02/2018 02/05/18   Lestine Box, PA-C    Allergies    Patient has no known allergies.  Review of Systems   Review of Systems  Constitutional: Negative for fever.  HENT: Positive for dental problem. Negative for congestion.   Eyes: Negative for visual disturbance.  Respiratory: Negative for chest tightness.   Cardiovascular: Negative for chest pain.  Gastrointestinal: Negative for abdominal pain.  Genitourinary: Negative for difficulty urinating.  Musculoskeletal: Negative for arthralgias.  Neurological: Negative for dizziness.  Hematological: Negative for adenopathy.  Psychiatric/Behavioral: Negative for agitation.  All other systems reviewed and are negative.   Physical Exam Updated Vital  Signs BP (!) 137/97 (BP Location: Left Arm)   Pulse 68   Temp 98.9 F (37.2 C) (Oral)   Resp 15   SpO2 99%   Physical Exam Vitals and nursing note reviewed.  Constitutional:      General: He is not in acute distress.    Appearance: Normal appearance.  HENT:     Head: Normocephalic and atraumatic.     Nose: Nose normal.     Mouth/Throat:     Mouth: Mucous membranes are moist. No angioedema.     Dentition: Dental caries present. No gingival swelling or dental abscesses.     Pharynx: Oropharynx is clear.     Comments: No trismus,  Mild edema  of the philtrum without erythema, no fluctuance. No swelling of the lips tongue, oral mucosa, no uvula swelling Eyes:     Conjunctiva/sclera: Conjunctivae normal.     Pupils: Pupils are equal, round, and reactive to light.  Cardiovascular:     Rate and Rhythm: Normal rate and regular rhythm.     Pulses: Normal pulses.     Heart sounds: Normal heart sounds.  Pulmonary:     Effort: Pulmonary effort is normal.     Breath sounds: Normal breath sounds.  Abdominal:     General: Abdomen is flat. Bowel sounds are normal.     Tenderness: There is no abdominal tenderness. There is no guarding or rebound.  Musculoskeletal:        General: Normal range of motion.     Cervical back: Normal range of motion and neck supple. No rigidity.  Skin:    General: Skin is warm and dry.     Capillary Refill: Capillary refill takes less than 2 seconds.  Neurological:     General: No focal deficit present.     Mental Status: He is alert and oriented to person, place, and time.     Deep Tendon Reflexes: Reflexes normal.  Psychiatric:        Mood and Affect: Mood normal.        Behavior: Behavior normal.     ED Results / Procedures / Treatments   Labs (all labs ordered are listed, but only abnormal results are displayed) Labs Reviewed - No data to display  EKG None  Radiology No results found.  Procedures Procedures (including critical care time)  Medications Ordered in ED Medications  acetaminophen (TYLENOL) tablet 1,000 mg (has no administration in time range)  diphenhydrAMINE (BENADRYL) capsule 25 mg (has no administration in time range)    ED Course  I have reviewed the triage vital signs and the nursing notes.  Pertinent labs & imaging results that were available during my care of the patient were reviewed by me and considered in my medical decision making (see chart for details).   Mild swelling of the philtrum without evidence of abscess.  There is nothing to I/D.  The patient has  not taken enough antibiotics for this to improve as he has not even completed 24 hours of these medications and will need at least 48 hours if this is reactive from caries.  Continue antibiotics.  Ice and NSAIDs.  Follow up with dentistry.  Resource guide given.  There are no signs of an allergic reaction.    Darius Espinoza was evaluated in Emergency Department on 06/14/2019 for the symptoms described in the history of present illness. He was evaluated in the context of the global COVID-19 pandemic, which necessitated consideration that the patient might be at risk  for infection with the SARS-CoV-2 virus that causes COVID-19. Institutional protocols and algorithms that pertain to the evaluation of patients at risk for COVID-19 are in a state of rapid change based on information released by regulatory bodies including the CDC and federal and state organizations. These policies and algorithms were followed during the patient's care in the ED.  Final Clinical Impression(s) / ED Diagnoses  Return for weakness, numbness, changes in vision or speech, fevers >100.4 unrelieved by medication, shortness of breath, intractable vomiting, or diarrhea, abdominal pain, Inability to tolerate liquids or food, cough, altered mental status or any concerns. No signs of systemic illness or infection. The patient is nontoxic-appearing on exam and vital signs are within normal limits.   I have reviewed the triage vital signs and the nursing notes. Pertinent labs &imaging results that were available during my care of the patient were reviewed by me and considered in my medical decision making (see chart for details).  After history, exam, and medical workup I feel the patient has been appropriately medically screened and is safe for discharge home. Pertinent diagnoses were discussed with the patient. Patient was givenstrictreturn precautions.    Gordy Goar, MD 06/14/19 3875

## 2019-06-15 ENCOUNTER — Telehealth (HOSPITAL_COMMUNITY): Payer: Self-pay

## 2019-06-15 NOTE — ED Provider Notes (Signed)
Pt called stating that he thinks he is having an allergic reaction to the Augmentin. Instructed to discontinue this medication. Called in Clindamycin 150 mg TID x 7 days to pharmacy.   Moshe Cipro, NP 06/15/19 [phone]

## 2019-09-25 ENCOUNTER — Ambulatory Visit (HOSPITAL_COMMUNITY)
Admission: EM | Admit: 2019-09-25 | Discharge: 2019-09-25 | Disposition: A | Discharge status: Home / Self Care | Payer: Self-pay | Attending: Family Medicine | Admitting: Family Medicine

## 2019-09-25 ENCOUNTER — Other Ambulatory Visit: Payer: Self-pay

## 2019-09-25 ENCOUNTER — Encounter (HOSPITAL_COMMUNITY): Payer: Self-pay | Admitting: Emergency Medicine

## 2019-09-25 DIAGNOSIS — S93401A Sprain of unspecified ligament of right ankle, initial encounter: Secondary | ICD-10-CM

## 2019-09-25 MED ORDER — IBUPROFEN 800 MG PO TABS: 800.0000 mg | ORAL_TABLET | Freq: Three times a day (TID) | ORAL | 0 refills | Status: DC

## 2019-09-25 NOTE — ED Triage Notes (Signed)
Pt c/o right foot ankle and foot pain onset yesterday he states he was playing basketball and had an ankle inversion. He states he was able to finish playing basketball. He has an ASO and has been using hydrocodone for pain.

## 2019-09-25 NOTE — Discharge Instructions (Signed)
Ice Elevation. Ibuprofen every 8 hours for pain as needed.  Use of brace.  Activity as tolerated.  Follow up with sports medicine as needed if persistent I would avoid basketball for the next two weeks

## 2019-09-26 NOTE — ED Provider Notes (Signed)
[facility]    CSN: [id number] Arrival date & time: 09/25/19  1621      History   Chief Complaint Chief Complaint  Patient presents with  . Foot Pain    HPI Darius Espinoza is a 29 y.o. male.   Darius Espinoza presents with complaints of right foot and ankle pain after injury last night. He was playing basketball, jumped, and with landing rolled his ankle. He has been ambulatory. Pain is improved today. Has had similar injury in the past. Took a hydrocodone for pain which didn't help. No numbness tingling or weakness to the foot.    ROS per HPI, negative if not otherwise mentioned.      Past Medical History:  Diagnosis Date  . Blood in stool   . Chicken pox   . GERD (gastroesophageal reflux disease)   . Migraine     Patient Active Problem List   Diagnosis Date Noted  . Achilles rupture, left 02/17/2018  . Bacterial conjunctivitis of right eye 01/29/2017  . Routine general medical examination at a health care facility 06/18/2014  . Generalized headaches 03/11/2014    Past Surgical History:  Procedure Laterality Date  . HERNIA REPAIR         Home Medications    Prior to Admission medications   Medication Sig Start Date End Date Taking? Authorizing Provider  ibuprofen (ADVIL) 800 MG tablet Take 1 tablet (800 mg total) by mouth 3 (three) times daily. 09/25/19   Georgetta Haber, NP  naproxen (NAPROSYN) 375 MG tablet Take 1 tablet (375 mg total) by mouth 2 (two) times daily. Patient not taking: Reported on 05/02/2018 02/05/18   Rennis Harding, PA-C    Family History Family History  Problem Relation Age of Onset  . Diabetes Mother   . Heart disease Mother   . Hypertension Mother   . Diabetes Father   . Diabetes Sister   . Hypertension Sister   . Breast cancer Maternal Grandmother   . Breast cancer Paternal Grandmother     Social History Social History   Tobacco Use  . Smoking status: Former Smoker    Packs/day: 0.01    Years: 0.00    Pack  years: 0.00  . Smokeless tobacco: Never Used  Vaping Use  . Vaping Use: Never used  Substance Use Topics  . Alcohol use: Yes    Alcohol/week: 2.0 standard drinks    Types: 1 Cans of beer, 1 Shots of liquor per week    Comment: socially  . Drug use: No     Allergies   Augmentin [amoxicillin-pot clavulanate]   Review of Systems Review of Systems   Physical Exam Triage Vital Signs ED Triage Vitals [09/25/19 1754]  Enc Vitals Group     BP (!) 132/81     Pulse Rate 76     Resp 15     Temp 98 F (36.7 C)     Temp Source Oral     SpO2 100 %     Weight      Height      Head Circumference      Peak Flow      Pain Score 6     Pain Loc      Pain Edu?      Excl. in GC?    No data found.  Updated Vital Signs BP (!) 132/81 (BP Location: Right Arm)   Pulse 76   Temp 98 F (36.7 C) (Oral)   Resp 15  SpO2 100%   Visual Acuity Right Eye Distance:   Left Eye Distance:   Bilateral Distance:    Right Eye Near:   Left Eye Near:    Bilateral Near:     Physical Exam Constitutional:      Appearance: He is well-developed.  Cardiovascular:     Rate and Rhythm: Normal rate.  Pulmonary:     Effort: Pulmonary effort is normal.  Musculoskeletal:     Right ankle: Tenderness present over the ATF ligament and AITF ligament. No lateral malleolus or medial malleolus tenderness. Normal range of motion.     Right Achilles Tendon: Normal.     Right foot: Normal.     Comments: Soft tissue tenderness and mild swelling without any bony tenderness, no bruising; strong pulse; full ROM; cap refill < 2 seconds    Skin:    General: Skin is warm and dry.  Neurological:     Mental Status: He is alert and oriented to person, place, and time.      UC Treatments / Results  Labs (all labs ordered are listed, but only abnormal results are displayed) Labs Reviewed - No data to display  EKG   Radiology No results found.  Procedures Procedures (including critical care  time)  Medications Ordered in UC Medications - No data to display  Initial Impression / Assessment and Plan / UC Course  I have reviewed the triage vital signs and the nursing notes.  Pertinent labs & imaging results that were available during my care of the patient were reviewed by me and considered in my medical decision making (see chart for details).     No bony tenderness; neurovascularly intact. Ambulatory since injury. Imaging deferred, patient agreeable. Pain management and rehab discussed. Patient verbalized understanding and agreeable to plan.   Final Clinical Impressions(s) / UC Diagnoses   Final diagnoses:  Sprain of right ankle, unspecified ligament, initial encounter     Discharge Instructions     Ice Elevation. Ibuprofen every 8 hours for pain as needed.  Use of brace.  Activity as tolerated.  Follow up with sports medicine as needed if persistent I would avoid basketball for the next two weeks     ED Prescriptions    Medication Sig Dispense Auth. Provider   ibuprofen (ADVIL) 800 MG tablet Take 1 tablet (800 mg total) by mouth 3 (three) times daily. 30 tablet Georgetta Haber, NP     PDMP not reviewed this encounter.   Georgetta Haber, NP 09/26/19 2147

## 2019-11-16 ENCOUNTER — Ambulatory Visit (INDEPENDENT_AMBULATORY_CARE_PROVIDER_SITE_OTHER): Payer: No Typology Code available for payment source

## 2019-11-16 ENCOUNTER — Ambulatory Visit (INDEPENDENT_AMBULATORY_CARE_PROVIDER_SITE_OTHER): Payer: No Typology Code available for payment source | Admitting: Family

## 2019-11-16 ENCOUNTER — Other Ambulatory Visit: Payer: Self-pay

## 2019-11-16 VITALS — BP 120/70 | HR 80 | Temp 98.5°F | Ht 68.0 in | Wt 158.0 lb

## 2019-11-16 DIAGNOSIS — M79671 Pain in right foot: Secondary | ICD-10-CM

## 2019-11-16 MED ORDER — MELOXICAM 15 MG PO TABS: 15.0000 mg | ORAL_TABLET | Freq: Every day | ORAL | 0 refills | Status: AC

## 2019-11-16 NOTE — Progress Notes (Signed)
Darius Espinoza is a 29 y.o. male with the following history as recorded in [employer]:  Patient Active Problem List   Diagnosis Date Noted  . Achilles rupture, left 02/17/2018  . Bacterial conjunctivitis of right eye 01/29/2017  . Routine general medical examination at a health care facility 06/18/2014  . Generalized headaches 03/11/2014    Current Outpatient Medications  Medication Sig Dispense Refill  . meloxicam (MOBIC) 15 MG tablet Take 1 tablet (15 mg total) by mouth daily. 30 tablet 0   No current facility-administered medications for this visit.    Allergies: Augmentin [amoxicillin-pot clavulanate]  Past Medical History:  Diagnosis Date  . Blood in stool   . Chicken pox   . GERD (gastroesophageal reflux disease)   . Migraine     Past Surgical History:  Procedure Laterality Date  . HERNIA REPAIR      Family History  Problem Relation Age of Onset  . Diabetes Mother   . Heart disease Mother   . Hypertension Mother   . Diabetes Father   . Diabetes Sister   . Hypertension Sister   . Breast cancer Maternal Grandmother   . Breast cancer Paternal Grandmother     Social History   Tobacco Use  . Smoking status: Former Smoker    Packs/day: 0.01    Years: 0.00    Pack years: 0.00  . Smokeless tobacco: Never Used  Substance Use Topics  . Alcohol use: Yes    Alcohol/week: 2.0 standard drinks    Types: 1 Cans of beer, 1 Shots of liquor per week    Comment: socially    Subjective:   Patient complaining of right foot pain/ swelling x 2-3 weeks; injured his foot while playing basketball; went to [facility] on July 23 and was told that he had soft tissue injury; works as a Comptroller- stands all day;    Objective:  Vitals:   11/16/19 1038  BP: 120/70  Pulse: 80  Temp: 98.5 F (36.9 C)  TempSrc: Oral  SpO2: 97%  Weight: 158 lb (71.7 kg)  Height: 5\' 8"  (1.727 m)    General: Well developed, well nourished, in no acute distress  Head: Normocephalic and atraumatic   Lungs: Respirations unlabored;  Musculoskeletal: No deformities; no active joint inflammation  Extremities: No edema, cyanosis, clubbing  Vessels: Symmetric bilaterally  Neurologic: Alert and oriented; speech intact; face symmetrical; moves all extremities well; CNII-XII intact without focal deficit   Assessment:  1. Right foot pain     Plan:  1. Initial visit to [facility] was on 7/23; will update X-ray today; trial of Mobic 15 mg daily; refer to sports medicine; 2. ? Cystic structure behind left knee- will refer to sports medicine;  This visit occurred during the SARS-CoV-2 public health emergency.  Safety protocols were in place, including screening questions prior to the visit, additional usage of staff PPE, and extensive cleaning of exam room while observing appropriate contact time as indicated for disinfecting solutions.     No follow-ups on file.  Orders Placed This Encounter  Procedures  . DG Foot Complete Right    Standing Status:   Future    Number of Occurrences:   1    Standing Expiration Date:   11/15/2020    Order Specific Question:   Reason for Exam (SYMPTOM  OR DIAGNOSIS REQUIRED)    Answer:   xray right foot    Order Specific Question:   Preferred imaging location?    Answer:   11/17/2020  [facility]    Order Specific Question:   Radiology Contrast Protocol - do NOT remove file path    Answer:   [email or web address]    Requested Prescriptions   Signed Prescriptions Disp Refills  . meloxicam (MOBIC) 15 MG tablet 30 tablet 0    Sig: Take 1 tablet (15 mg total) by mouth daily.

## 2019-11-20 ENCOUNTER — Ambulatory Visit (INDEPENDENT_AMBULATORY_CARE_PROVIDER_SITE_OTHER): Payer: No Typology Code available for payment source | Admitting: Family Medicine

## 2019-11-20 ENCOUNTER — Ambulatory Visit: Payer: Self-pay

## 2019-11-20 ENCOUNTER — Encounter: Payer: Self-pay | Admitting: Family Medicine

## 2019-11-20 ENCOUNTER — Other Ambulatory Visit: Payer: Self-pay

## 2019-11-20 ENCOUNTER — Ambulatory Visit (INDEPENDENT_AMBULATORY_CARE_PROVIDER_SITE_OTHER): Payer: No Typology Code available for payment source

## 2019-11-20 VITALS — BP 120/82 | HR 87 | Ht 68.0 in | Wt 153.0 lb

## 2019-11-20 DIAGNOSIS — M25561 Pain in right knee: Secondary | ICD-10-CM | POA: Diagnosis not present

## 2019-11-20 DIAGNOSIS — G8929 Other chronic pain: Secondary | ICD-10-CM

## 2019-11-20 NOTE — Patient Instructions (Signed)
Thank you for coming in today.  Please get an Xray today before you leave   Lets try the exercises now.  Use voltaren gel on the knee and ankle as needed.   If not improving let me know and I will do PT.   Keep an eye on the bump. If it gets worse we can do MRI.   I recommend you obtained a compression sleeve to help with your joint problems. There are many options on the market however I recommend obtaining a full ankle Body Helix compression sleeve.  You can find information (including how to appropriate measure yourself for sizing) can be found at www.[employer].ch.  Many of these products are health savings account (HSA) eligible.   You can use the compression sleeve at any time throughout the day but is most important to use while being active as well as for 2 hours post-activity.   It is appropriate to ice following activity with the compression sleeve in place.

## 2019-11-20 NOTE — Progress Notes (Signed)
Subjective:   I, Darius Espinoza, am serving as a scribe for Dr. Clementeen Graham. This visit occurred during the SARS-CoV-2 public health emergency.  Safety protocols were in place, including screening questions prior to the visit, additional usage of staff PPE, and extensive cleaning of exam room while observing appropriate contact time as indicated for disinfecting solutions.   I'm seeing this patient as a consultation for:  Darius Espinoza. Note will be routed back to referring provider/PCP.  CC: L knee and R foot pain  HPI: Pt is a 29 y/o male presenting w/ c/o L knee and R foot/ankle pain. Right foot pain for past 3 weeks after playing basketball. Sprained ankle. Pain in over lateral aspect. Continues to play with ankle wrap.   Noticed swelling over medial aspect of right knee. Started to be painful and went into PCP.  He notes this has not changed much and is not growing.  Is only minimally painful.  Also left knee pain with knee flexion in anterior to medial aspect.  This is been ongoing for a few months occurring after basketball.  He denies locking or catching or giving way but notes pain at the medial joint line occasionally.  He is tried some ibuprofen and meloxicam which helps a little.   R foot/ankle pain: Began on 09/24/19 when he rolled his ankle into inversion while playing basketball.  He was seen at the [facility] on 09/25/19 and was advised to use RICE principles and take IBU prn for pain.  Since then, he was seen by primary care on 11/16/19 w/ c/o con't R ankle/foot pain. -Swelling: -Aggravating factors: -Treatments tried: IBU, Meloxicam  Diagnostic imaging: R foot XR- 11/16/19  Past medical history, Surgical history, Family history, Social history, Allergies, and medications have been entered into the medical record, reviewed.   Review of Systems: No new headache, visual changes, nausea, vomiting, diarrhea, constipation, dizziness, abdominal pain, skin rash, fevers,  chills, night sweats, weight loss, swollen lymph nodes, body aches, joint swelling, muscle aches, chest pain, shortness of breath, mood changes, visual or auditory hallucinations.   Objective:    Vitals:   11/20/19 1010  BP: 120/82  Pulse: 87  SpO2: 98%   General: Well Developed, well nourished, and in no acute distress.  Neuro/Psych: Alert and oriented x3, extra-ocular muscles intact, able to move all 4 extremities, sensation grossly intact. Skin: Warm and dry, no rashes noted.  Respiratory: Not using accessory muscles, speaking in full sentences, trachea midline.  Cardiovascular: Pulses palpable, no extremity edema. Abdomen: Does not appear distended. MSK: Right ankle normal-appearing Normal motion. Tender palpation ATFL region. Normal ankle motion. Stable ligamentous exam. Intact strength.  Right knee normal-appearing Normal motion. Small nodule present medial knee about 3 cm distal to the joint line.  Present is soft tissue.  Mobile not particularly tender.  Left knee normal-appearing Normal motion. Stable ligamentous exam. Intact strength. Negative Murray's test. Tender palpation medial joint line.  Lab and Radiology Results X-ray images right ankle obtained today personally and independently interpreted No acute fractures.  Normal alignment no significant degenerative changes. Await formal radiology review  X-rays obtained September 13 right foot images personally independently interpreted today No degenerative changes or fractures visible.  Diagnostic Limited MSK Ultrasound of: Right knee nodule and left knee Right knee nodule small isoechoic circular structure with tiny surrounding hypoechoic fluid soft tissue medial knee.  No increased vascular activity.    Left knee: Quad tendon normal-appearing Trace joint effusion. Patellar tendon normal.  Medial joint line narrowed slightly with possible hypoechoic change medial meniscus. Lateral joint line  normal-appearing Posterior knee no Baker's cyst.  Impression: Right knee nodule uncertain diagnosis.  Possible phlebolith or lipoma. Right knee: Medial joint mild DJD and possible meniscus tear     Impression and Recommendations:    Assessment and Plan: 29 y.o. male with right ankle sprain.  Ongoing for 3 weeks which is not typical for simple ankle strength.  X-ray foot and ankle obtained on the 13th and today are normal-appearing.  Discussed options.  Home exercise reviewed with patient with athletic training staff in clinic today.  If not approved we will proceed with formal physical therapy.  Discussed prescription strength NSAID management and dosing protocol.  Also reviewed ankle bracing and compression sleeve protocol.  Right knee nodule.  Uncertain diagnosis at this point.  Appears to be benign on ultrasound however this is not sufficient for formal diagnosis.  Recommend watchful waiting and if worsening proceed with MRI with and without contrast.  Left knee pain: Medial compartment degenerative changes and possible meniscus tear.  Overall doing pretty well and minimally symptomatic without significant mechanical symptoms.  Watchful waiting and Voltaren gel..   Orders Placed This Encounter  Procedures  . [place] LIMITED JOINT SPACE STRUCTURES LOW RIGHT(NO LINKED CHARGES)    Order Specific Question:   Reason for Exam (SYMPTOM  OR DIAGNOSIS REQUIRED)    Answer:   right knee pain    Order Specific Question:   Preferred imaging location?    Answer:   [facility]  . DG Ankle Complete Right    Standing Status:   Future    Number of Occurrences:   1    Standing Expiration Date:   11/19/2020    Order Specific Question:   Reason for Exam (SYMPTOM  OR DIAGNOSIS REQUIRED)    Answer:   eval lateral ankle sprain    Order Specific Question:   Preferred imaging location?    Answer:   [facility]    Order Specific Question:   Radiology Contrast Protocol - do NOT  remove file path    Answer:   [email or web address]   No orders of the defined types were placed in this encounter.   Discussed warning signs or symptoms. Please see discharge instructions. Patient expresses understanding.   The above documentation has been reviewed and is accurate and complete Clementeen Graham, M.D.

## 2019-11-23 NOTE — Progress Notes (Signed)
Xray right ankle looks pretty normal to radiology.

## 2021-04-27 IMAGING — DX DG FOOT COMPLETE 3+V*R*
3 series · 3 of 3 positions shown · non-contrast
Comparison: None.

CLINICAL DATA: Dorsal foot pain after basketball injury 2 weeks
ago.

EXAM:
RIGHT FOOT COMPLETE - 3+ VIEW

[foot ap]
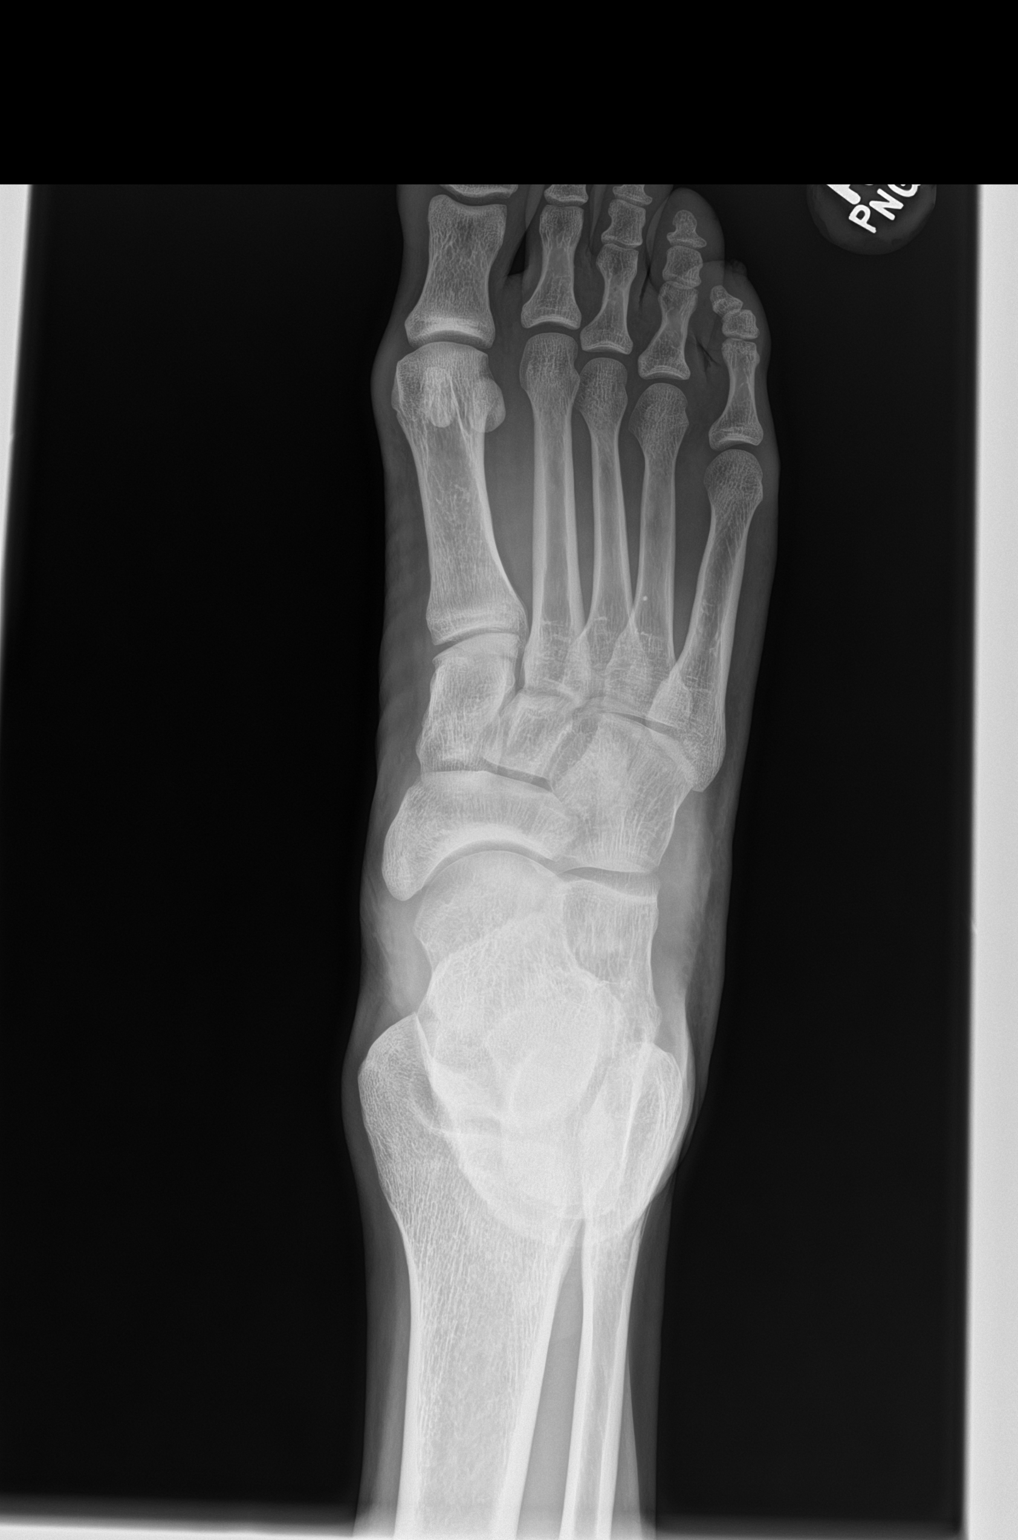

[foot obl]
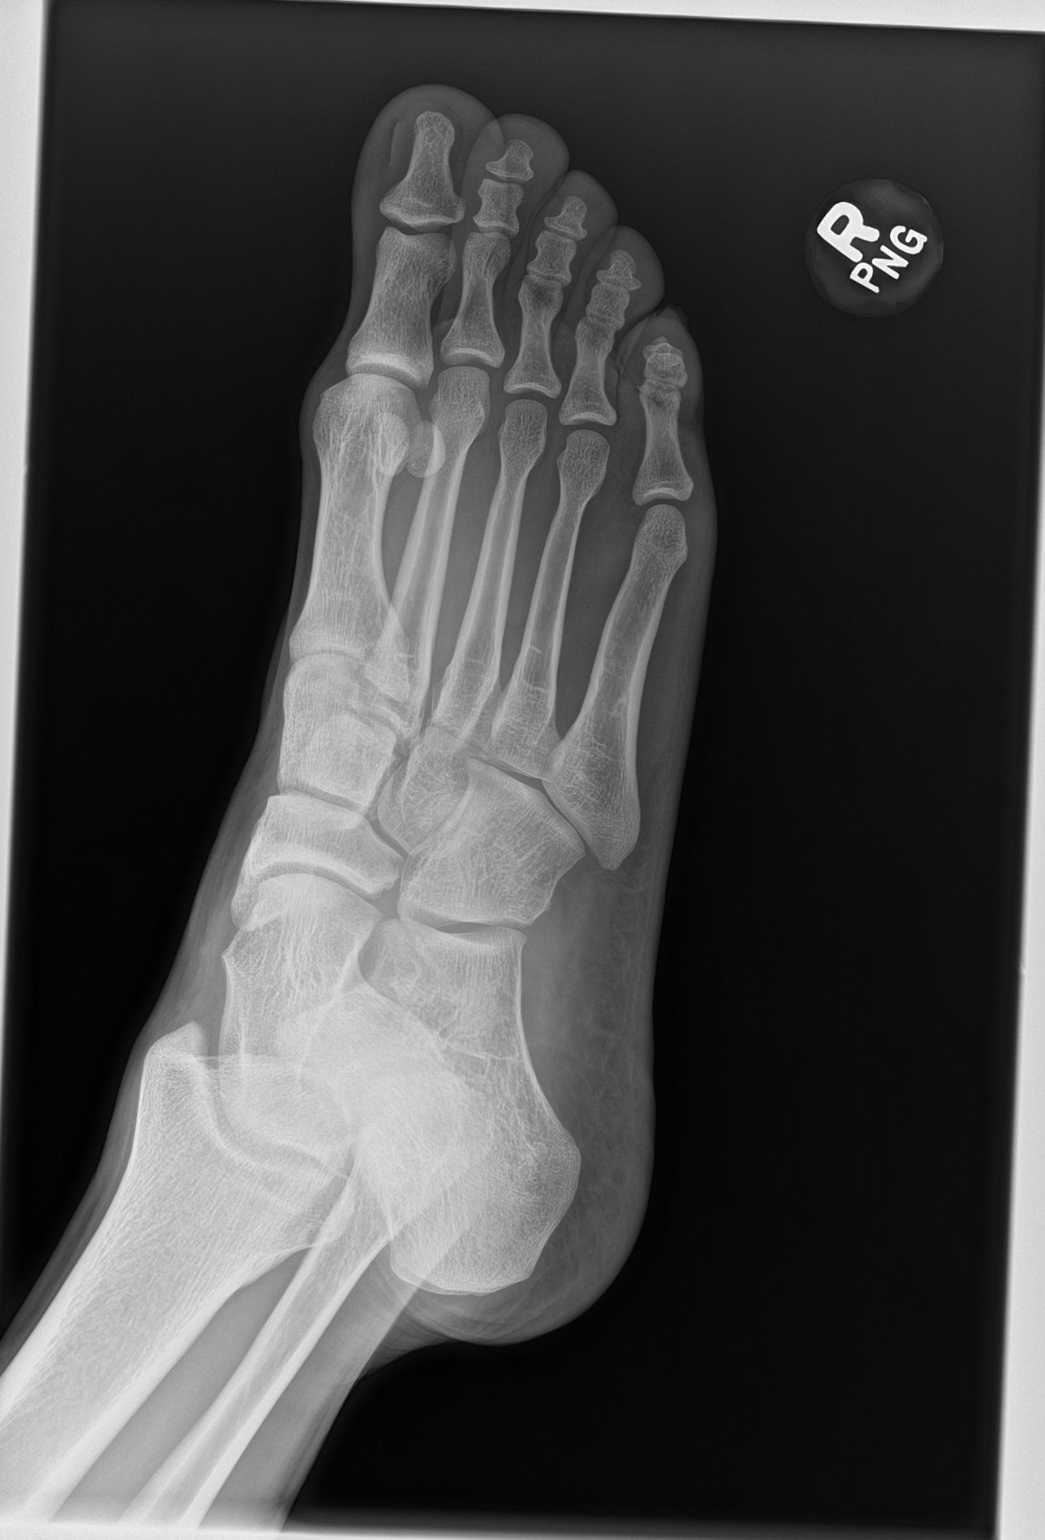

[foot lat]
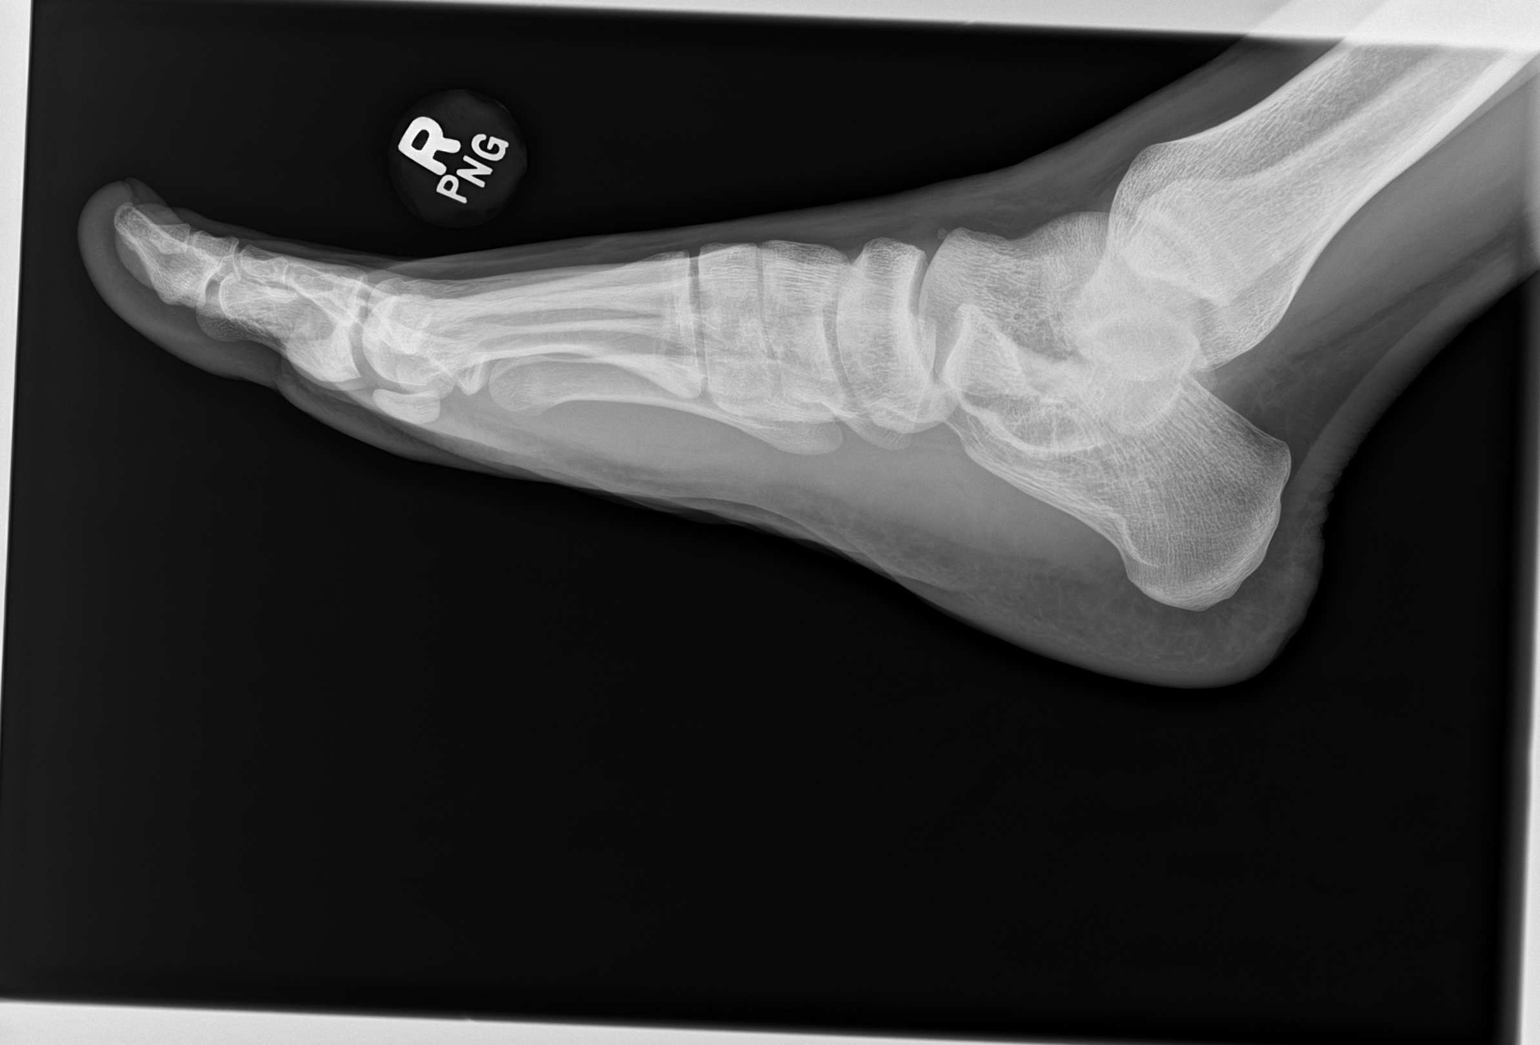

[3 of 3 positions shown; findings below may reference images not displayed]

FINDINGS: There is no evidence of fracture or dislocation. There is no
evidence of arthropathy or other focal bone abnormality. Soft
tissues are unremarkable.
IMPRESSION: Negative.

## 2021-05-01 IMAGING — DX DG ANKLE COMPLETE 3+V*R*
3 series · 3 of 3 positions shown · non-contrast
Comparison: None.

CLINICAL DATA: Lateral ankle pain, swelling

EXAM:
RIGHT ANKLE - COMPLETE 3+ VIEW

[ankle ap]
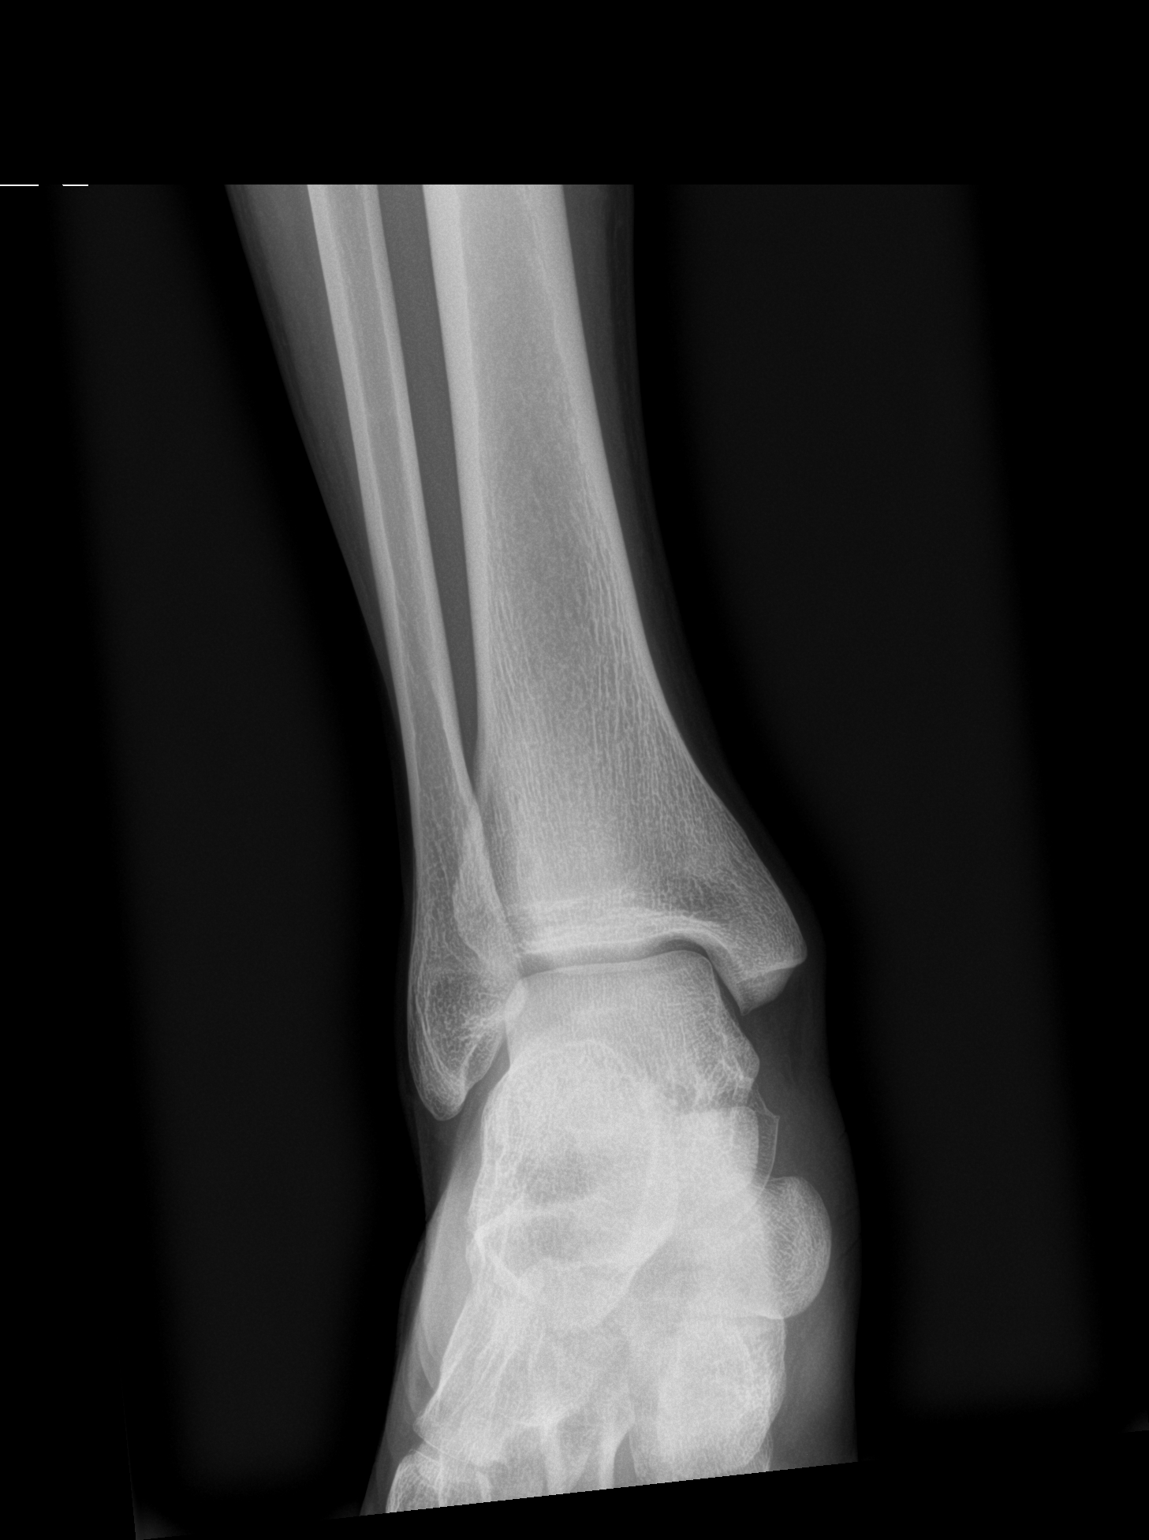

[ankle obl]
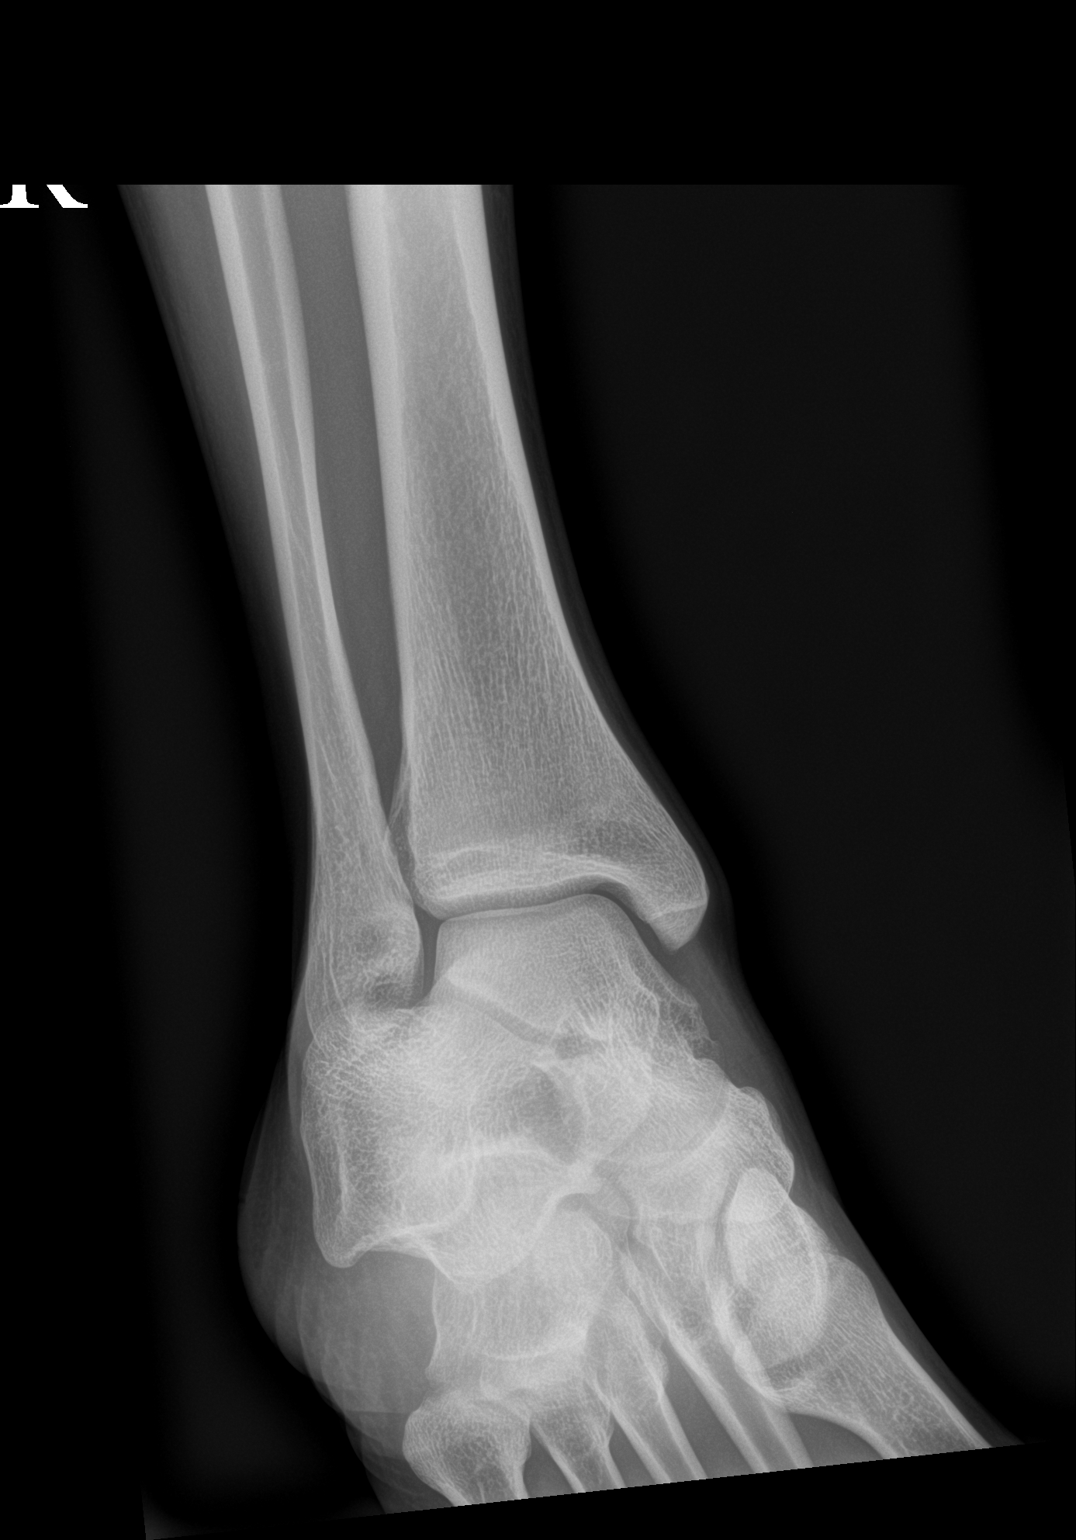

[ankle lat]
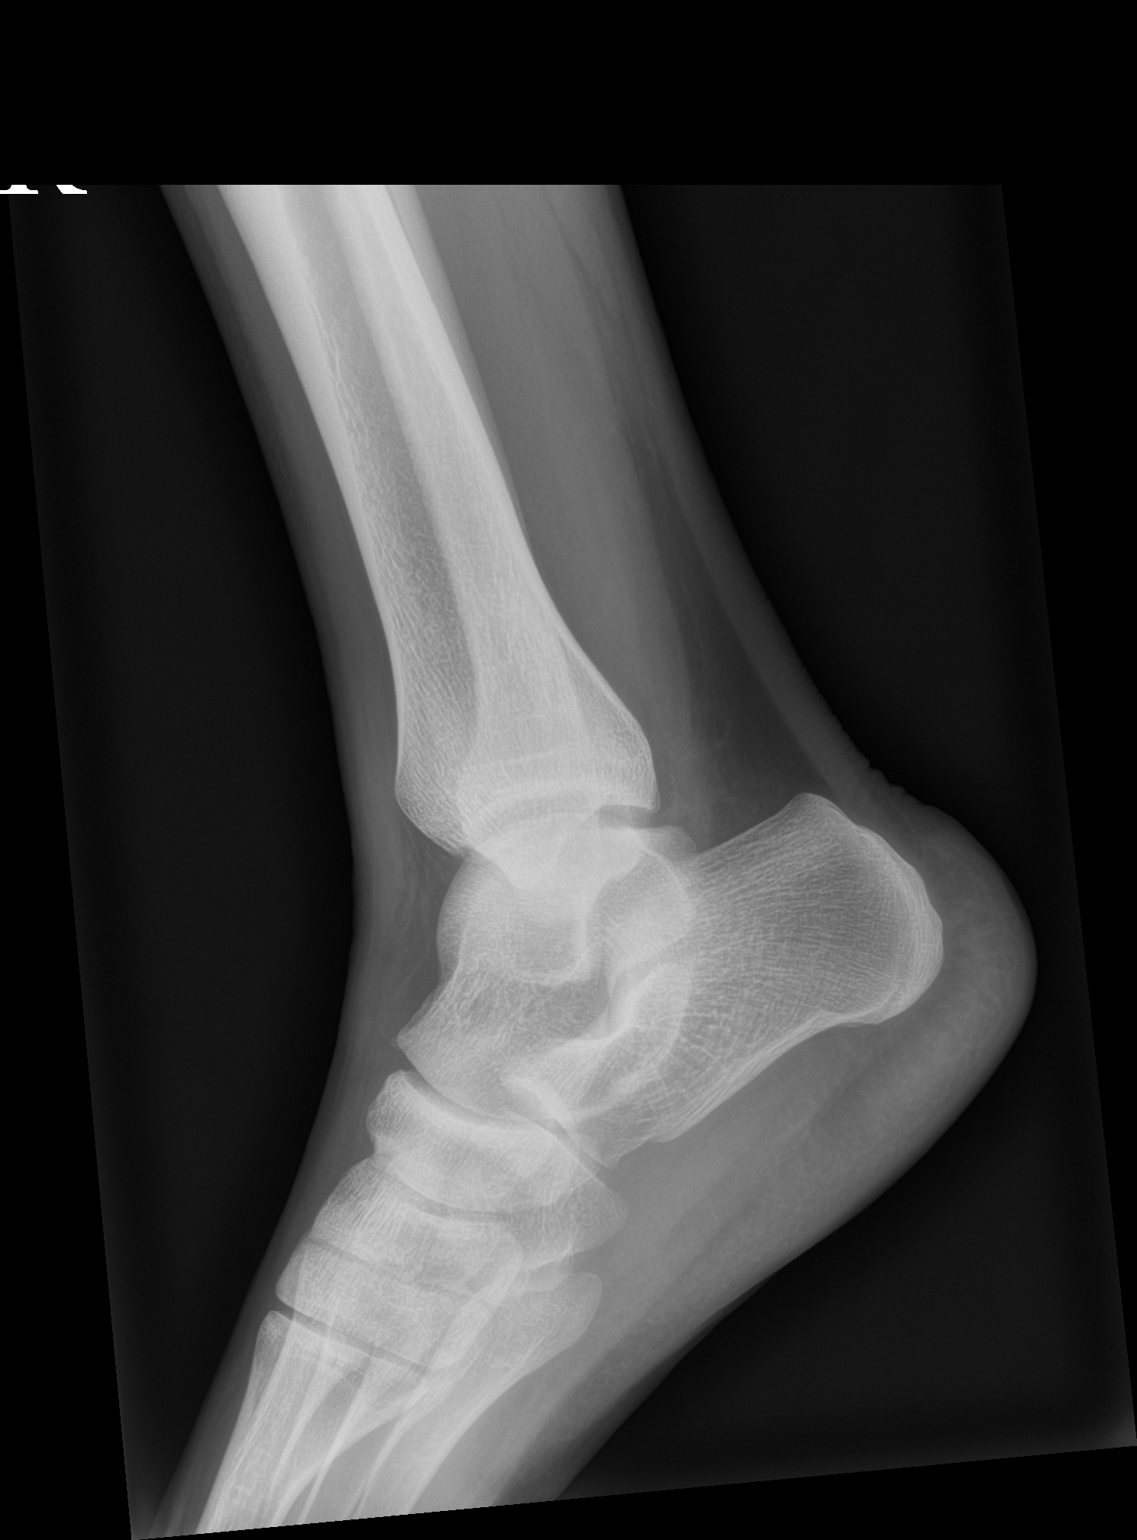

[3 of 3 positions shown; findings below may reference images not displayed]

FINDINGS: There is no evidence of fracture, dislocation, or joint effusion.
There is no evidence of arthropathy or other focal bone abnormality.
Soft tissues are unremarkable.
IMPRESSION: Negative.

## 2024-02-23 ENCOUNTER — Encounter (HOSPITAL_BASED_OUTPATIENT_CLINIC_OR_DEPARTMENT_OTHER): Payer: Self-pay | Admitting: *Deleted

## 2024-02-23 ENCOUNTER — Other Ambulatory Visit: Payer: Self-pay

## 2024-02-23 ENCOUNTER — Emergency Department (HOSPITAL_BASED_OUTPATIENT_CLINIC_OR_DEPARTMENT_OTHER): Payer: Self-pay

## 2024-02-23 DIAGNOSIS — L03011 Cellulitis of right finger: Secondary | ICD-10-CM | POA: Insufficient documentation

## 2024-02-23 NOTE — ED Triage Notes (Signed)
 Swelling to right thumb, patient concerned for infection.

## 2024-02-24 ENCOUNTER — Emergency Department (HOSPITAL_BASED_OUTPATIENT_CLINIC_OR_DEPARTMENT_OTHER)
Admission: EM | Admit: 2024-02-24 | Discharge: 2024-02-24 | Disposition: A | Discharge status: Home / Self Care | Payer: Self-pay | Attending: Emergency Medicine | Admitting: Emergency Medicine

## 2024-02-24 DIAGNOSIS — L03011 Cellulitis of right finger: Secondary | ICD-10-CM

## 2024-02-24 MED ORDER — CEPHALEXIN 250 MG PO CAPS
500.0000 mg | ORAL_CAPSULE | Freq: Once | ORAL | Status: AC
  Administered 2024-02-24: 500 mg via ORAL
  Filled 2024-02-24: qty 2

## 2024-02-24 MED ORDER — CEPHALEXIN 500 MG PO CAPS: 500.0000 mg | ORAL_CAPSULE | Freq: Four times a day (QID) | ORAL | 0 refills | Status: AC

## 2024-02-24 NOTE — ED Provider Notes (Signed)
" °  [place] EMERGENCY DEPARTMENT AT [facility] Provider Note   CSN: [id number] Arrival date & time: 02/23/24  2235     Patient presents with: Hand Pain   Darius Espinoza is a 33 y.o. male.   Patient is a 33 year old male presenting with complaints of right thumb pain and swelling.  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  The wound became red and drained a small amount of pus.  He has some ongoing redness and is concerned it may be infected.       Prior to Admission medications  Medication Sig Start Date End Date Taking? Authorizing Provider  meloxicam  (MOBIC ) 15 MG tablet Take 1 tablet (15 mg total) by mouth daily. 11/16/19   Jason Leita Repine, FNP    Allergies: Augmentin  [amoxicillin -pot clavulanate]    Review of Systems  All other systems reviewed and are negative.   Updated Vital Signs BP (!) 147/91   Pulse 75   Temp 98 F (36.7 C) (Oral)   Resp 12   SpO2 98%   Physical Exam Vitals and nursing note reviewed.  HENT:     Head: Normocephalic.  Pulmonary:     Effort: Pulmonary effort is normal.  Musculoskeletal:     Comments: To the extensor surface of the distal phalanx of the right thumb just below the nail, there is a small healing laceration with surrounding erythema.  He has good range of motion of the IP joint.  There are no streaks into the hand or wrist.  Skin:    General: Skin is warm and dry.  Neurological:     Mental Status: He is alert and oriented to person, place, and time.     (all labs ordered are listed, but only abnormal results are displayed) Labs Reviewed - No data to display  EKG: None  Radiology: DG Finger Thumb Right Result Date: 02/23/2024 EXAM: 3 VIEW(S) XRAY OF THE FINGER(S) 02/23/2024 11:21:00 PM COMPARISON: None available. CLINICAL HISTORY: swelling infection FINDINGS: BONES AND JOINTS: No acute fracture. No malalignment. SOFT TISSUES: Mild diffuse soft tissue  swelling. IMPRESSION: 1. Mild diffuse soft tissue swelling. No fracture. Electronically signed by: Norman Gatlin MD 02/23/2024 11:25 PM EST RP Workstation: [id number]     Procedures   Medications Ordered in the ED - No data to display                                  Medical Decision Making Amount and/or Complexity of Data Reviewed Radiology: ordered.   Patient with what appears to be an infected wound to his right thumb.  This will be treated with Keflex  and follow-up as needed.     Final diagnoses:  None    ED Discharge Orders     None          Geroldine Berg, MD 02/24/24 [phone]  "

## 2024-02-24 NOTE — Discharge Instructions (Addendum)
 Begin taking Keflex  as prescribed.  Apply bacitracin and Band-Aid twice daily.  Return to the ER if symptoms significantly worsen or change.
